# Patient Record
Sex: Male | Born: 1940 | Race: White | Hispanic: No | Smoking: Former smoker
Health system: Southern US, Community
[De-identification: ages and names within clinical notes are randomized; demographics above are authoritative.]

## PROBLEM LIST (undated history)

## (undated) DIAGNOSIS — N529 Male erectile dysfunction, unspecified: Secondary | ICD-10-CM

## (undated) DIAGNOSIS — Z8739 Personal history of other diseases of the musculoskeletal system and connective tissue: Secondary | ICD-10-CM

## (undated) DIAGNOSIS — E119 Type 2 diabetes mellitus without complications: Secondary | ICD-10-CM

## (undated) DIAGNOSIS — C61 Malignant neoplasm of prostate: Secondary | ICD-10-CM

## (undated) DIAGNOSIS — E785 Hyperlipidemia, unspecified: Secondary | ICD-10-CM

## (undated) DIAGNOSIS — E876 Hypokalemia: Secondary | ICD-10-CM

## (undated) DIAGNOSIS — I10 Essential (primary) hypertension: Secondary | ICD-10-CM

## (undated) DIAGNOSIS — E6609 Other obesity due to excess calories: Secondary | ICD-10-CM

## (undated) HISTORY — DX: Hypokalemia: E87.6

## (undated) HISTORY — DX: Personal history of other diseases of the musculoskeletal system and connective tissue: Z87.39

## (undated) HISTORY — DX: Other obesity due to excess calories: E66.09

## (undated) HISTORY — DX: Hyperlipidemia, unspecified: E78.5

## (undated) HISTORY — DX: Type 2 diabetes mellitus without complications: E11.9

## (undated) HISTORY — DX: Malignant neoplasm of prostate: C61

## (undated) HISTORY — DX: Essential (primary) hypertension: I10

## (undated) HISTORY — DX: Male erectile dysfunction, unspecified: N52.9

## (undated) HISTORY — PX: CHOLECYSTECTOMY: SHX55

---

## 1998-01-10 ENCOUNTER — Ambulatory Visit (HOSPITAL_COMMUNITY): Admission: RE | Admit: 1998-01-10 | Discharge: 1998-01-10 | Payer: Self-pay | Admitting: Internal Medicine

## 2012-06-06 ENCOUNTER — Ambulatory Visit: Payer: Self-pay | Admitting: Neurology

## 2012-09-05 ENCOUNTER — Ambulatory Visit: Payer: Self-pay | Admitting: Radiation Oncology

## 2012-10-01 ENCOUNTER — Ambulatory Visit: Payer: Self-pay | Admitting: Radiation Oncology

## 2012-10-30 LAB — CBC CANCER CENTER
Eosinophil %: 4.4 %
HCT: 42 % (ref 40.0–52.0)
HGB: 14.6 g/dL (ref 13.0–18.0)
Lymphocyte %: 26 %
MCH: 29.8 pg (ref 26.0–34.0)
MCHC: 34.9 g/dL (ref 32.0–36.0)
MCV: 85 fL (ref 80–100)
Neutrophil #: 4 x10 3/mm (ref 1.4–6.5)
Neutrophil %: 60 %
Platelet: 238 x10 3/mm (ref 150–440)
RBC: 4.92 10*6/uL (ref 4.40–5.90)

## 2012-11-01 ENCOUNTER — Ambulatory Visit: Payer: Self-pay | Admitting: Radiation Oncology

## 2012-11-06 LAB — CBC CANCER CENTER
Basophil %: 0.6 %
Eosinophil %: 6.4 %
HGB: 14.4 g/dL (ref 13.0–18.0)
MCHC: 35.3 g/dL (ref 32.0–36.0)
Monocyte #: 0.6 x10 3/mm (ref 0.2–1.0)
Monocyte %: 9.5 %
Neutrophil #: 3.8 x10 3/mm (ref 1.4–6.5)
Neutrophil %: 58 %
Platelet: 218 x10 3/mm (ref 150–440)

## 2012-11-20 LAB — CBC CANCER CENTER
Basophil #: 0 x10 3/mm (ref 0.0–0.1)
Basophil %: 0.4 %
Eosinophil #: 0.2 x10 3/mm (ref 0.0–0.7)
HGB: 14.2 g/dL (ref 13.0–18.0)
Lymphocyte #: 1.2 x10 3/mm (ref 1.0–3.6)
MCH: 29.6 pg (ref 26.0–34.0)
MCV: 84 fL (ref 80–100)
Monocyte #: 0.7 x10 3/mm (ref 0.2–1.0)
Neutrophil %: 64 %
RBC: 4.79 10*6/uL (ref 4.40–5.90)
WBC: 5.7 x10 3/mm (ref 3.8–10.6)

## 2012-11-27 LAB — CBC CANCER CENTER
Basophil #: 0 x10 3/mm (ref 0.0–0.1)
Basophil %: 0.4 %
HCT: 39.2 % — ABNORMAL LOW (ref 40.0–52.0)
HGB: 13.7 g/dL (ref 13.0–18.0)
MCH: 29.7 pg (ref 26.0–34.0)
MCHC: 34.9 g/dL (ref 32.0–36.0)
Monocyte %: 9.5 %
Neutrophil #: 4 x10 3/mm (ref 1.4–6.5)
RDW: 13.5 % (ref 11.5–14.5)
WBC: 5.7 x10 3/mm (ref 3.8–10.6)

## 2012-11-29 ENCOUNTER — Ambulatory Visit: Payer: Self-pay | Admitting: Radiation Oncology

## 2012-12-30 ENCOUNTER — Ambulatory Visit: Payer: Self-pay | Admitting: Radiation Oncology

## 2013-05-01 ENCOUNTER — Ambulatory Visit: Payer: Self-pay | Admitting: Radiation Oncology

## 2013-05-21 LAB — PSA: PSA: 0.5 ng/mL (ref 0.0–4.0)

## 2013-06-01 ENCOUNTER — Ambulatory Visit: Payer: Self-pay | Admitting: Radiation Oncology

## 2013-11-03 ENCOUNTER — Ambulatory Visit: Payer: Self-pay | Admitting: Radiation Oncology

## 2013-11-05 LAB — PSA: PSA: 0.2 ng/mL (ref 0.0–4.0)

## 2013-11-29 ENCOUNTER — Ambulatory Visit: Payer: Self-pay | Admitting: Radiation Oncology

## 2014-11-05 ENCOUNTER — Ambulatory Visit: Payer: Self-pay | Admitting: Radiation Oncology

## 2014-11-30 ENCOUNTER — Ambulatory Visit
Admit: 2014-11-30 | Disposition: A | Discharge status: Home / Self Care | Payer: Self-pay | Attending: Radiation Oncology | Admitting: Radiation Oncology

## 2015-01-18 NOTE — Consult Note (Signed)
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 He has no frequency and urgency but does complain of nocturia x2-3. PSA recently was found tobe 9.0 which prompted transrectal ultrasound-guided biopsy showing by lobar adenocarcinoma mostly Gleason Gleason score of 7 (3+4) with some Gleason 6 as well as high grade PIN. he has been evaluated at [facility] and markers were placed for image guideIMRT radiation therapy. He is seen at our facility today to discuss going ahead with treatment. He is doing well at the present time. He was also offered Lupron although decline that based on side effects of hot flashes and decreased  libido. Seen today consultation regarding radiation therapy  Pathology Report: pathology report reviewed    Imaging Report: bone scan not indicated   Referral Report: Clinical Notes Reviewed.  Planned Treatment Regimen:image guided IMRT radiation therapy   Past History: Medical History Diabetes, Hyperlipedemia and Hypertension.   Procedure/Surgical History excision of moles from back, and vasectomy.  Allergies:   No Known Allergies  Medications:   Amlodipine 1 (10 mg) TABLET ORAL daily (09/04/2012)      Atenolol 1 (100 mg) TABLET ORAL daily (09/04/2012)       Hydrochlorothiazide 1 (25 mg) TABLET ORAL daily (09/04/2012)      Lovastatin 1 (40 mg) TABLET ORAL daily (09/04/2012)      Lowdermill 1 (10 meq) TABLET ORAL b.i.d. (09/04/2012)      Metformin HCL 1 (1000 mg) TABLET ORAL b.i.d. (09/04/2012)      Niacin ER  (09/04/2012)  Review of Systems:   according to nurse's notesPatient denies any weight loss, fatigue, weakness, fever, chills or night sweats. Patient denies any loss of vision, blurred vision. Patient denies any ringing  othe ears or hearing loss. No irregular heartbeat. Patient denies heart murmur or history of fainting. Patient denies any chest pain or pain radiating to her upper extremities. Patient denies any shortness of breath, difficulty breathing at night, cough ohemoptysis. Patient denies any swelling in the lower legs. Patient denies any nausea vomiting, vomiting of blood, or coffee ground material in the vomitus. Patient denies any stomach pain. Patient states has had normal bowel movements no significant consor diarrhea. Patient denies any dysuria, hematuria or significant nocturia. Patient denies any problems walking, swelling in the joints or loss of balance. Patient denies any skin changes, loss of hair or loss of weight. Patient denies any excessiworrying or anxiety or significant depression. Patient denies any problems with insomnia. Patient denies excessive thirst, polyuria, polydipsia. Patient denies any swollen glands, patient denies easy bruising or easy bleeding. Patient denies any recent infections, allergies or URI. Patient "s visual fields have not changed significantly in recent time.  Nursing Vital Signs: Performed on 09/05/2012 8:10 AM  BMI - 30.763 kg/m2 (HIGH)  Height - 179.3 cm  Weight - 98.9 kg  Temperature - 98 F  Pulse - 69 /min  Respiration - 20 /min  Systolic - 073 mm(hg) (HIGH)  Diastolic - 77 mm(hg)  Pain - 0  Fatigue - 0  BP - 154/ 77  Physical Exam:  Constitutional No evidence of impaired  alertness, inadequate appearance, premature or advanced chronologic age, uncooperativeness, developmental delays, altered mood and affect and disorientation.  Head No evidence of alopecia, abnormal cephalic and scars.  Eyes No evidence of conjunctivitis, keratitis, hardening of the lens, nonreactive pupil(s), retinal abnormalities and scleral abnormalities.  ENMT No evidence of ear abnormalities, oral abnormalities, nasal obstruction, oropharynx obstruction, sinusitis, throat abnormalities and tongue abnormalities.  Neck No evidence of tender or enlarged lymph nodes, neck abnormalities, restricted range of motion and enlarged thyroid gland.  Integumentary No evidence of blistering, bruising, dry skin, erythema, nails changes, altered pigmentation, rash and urticaria.  Breasts No evidence of a breast mass(es), nipple discharge, nipple inversion, breast skin changes and asymmetry.  Chest No evidence of chest abnormalities and tender or enlarged lymph nodes.  Cardiovascular No evidence of arterial pulse(s) abnormalities, abnormal heart rate, heart arrhythmia and abnormal heart sounds.  Respiratory No evidence of abnormal breath sounds, chest abnormalities on palpation and chest abnormalities on percussion.  Gastrointestinal No evidence of anal and/or perineal abnormalities and rectal abnormalities.  Abdomen No evidence of abdominal abnormalities, abowel sounds, hepatomegaly and splenomegaly.  Genitourinary on rectal exam rectal sphincter tone is good. Prostate is somewhat nodular based on probable fiduciary marker placement. sulcus is preserved bilaterally. seminal vesicle regions appear within normal limits. No other rectal abnormalities identified.  Extremities No evidence of lower extremities abnormalities, tender or enlarged lymph nodes and upper extremities abnormalities.  Back/Spine No evidence of reduced flexibility and abnormal spinal curvature.  Musculoskeletal No evidence of bone abnormalities,  joint abnormalities, compromised muscle tone and restricted range of motion.  Neurologic No evidence of impaired cranial nerve(s), uncoordinated gait, motor impairment, a sensory deficit and impaired reflexes.  Hematologic/Lymphatic No evidence of tender or enlarged axillae lymph nodes, tender or enlarged lymph nodes, tender or enlarged groin lymph nodes, tender or enlarged neck lymph nodes and petechiae / purpura / ecchymosis.    Impression/Plan:   clinical stage IIa adenocarcinoma the prostate in 74 year old male with Gleason 7 (3+4) presenting with a PSA of 9  at this time patient has decide on going ahead with image guided IMRT radiation therapy. Would plan on delivering 8000 cGy over 8 weekusing his fiduciary markers and laboratory been placed. I have set him up for CT simulation after the first of the year which is fine with the patient. risks and benefits of treatment including increased probability of erectile dysfunction, diarrhea, insymptoms as urgency and frequency during treatment, all explained in detail to the patient. we'll be treating his prostate and proximal seminal vesicles. Do not see a need based on his PSA and Gleason score 4 adjuvant treatment of his pelvic lymph   would like to take this opportunity to thank you for allowing me to participate in this patient's care.     cc:  [facility]

## 2015-11-07 ENCOUNTER — Inpatient Hospital Stay: Admission: RE | Admit: 2015-11-07 | Payer: Self-pay | Source: Ambulatory Visit | Admitting: Radiation Oncology

## 2015-11-07 ENCOUNTER — Ambulatory Visit: Payer: Self-pay | Admitting: Radiation Oncology

## 2021-04-13 ENCOUNTER — Encounter: Payer: Self-pay | Admitting: Physician Assistant

## 2021-04-24 ENCOUNTER — Inpatient Hospital Stay: Payer: Medicare Other | Attending: Oncology | Admitting: Oncology

## 2021-04-24 ENCOUNTER — Inpatient Hospital Stay: Payer: Medicare Other

## 2021-04-24 ENCOUNTER — Encounter: Payer: Self-pay | Admitting: Oncology

## 2021-04-24 ENCOUNTER — Other Ambulatory Visit: Payer: Self-pay

## 2021-04-24 VITALS — BP 162/79 | HR 78 | Temp 98.0°F | Resp 18 | Wt 202.3 lb

## 2021-04-24 DIAGNOSIS — E785 Hyperlipidemia, unspecified: Secondary | ICD-10-CM | POA: Diagnosis not present

## 2021-04-24 DIAGNOSIS — Z8546 Personal history of malignant neoplasm of prostate: Secondary | ICD-10-CM | POA: Diagnosis not present

## 2021-04-24 DIAGNOSIS — Z833 Family history of diabetes mellitus: Secondary | ICD-10-CM | POA: Insufficient documentation

## 2021-04-24 DIAGNOSIS — Z79899 Other long term (current) drug therapy: Secondary | ICD-10-CM | POA: Diagnosis not present

## 2021-04-24 DIAGNOSIS — R972 Elevated prostate specific antigen [PSA]: Secondary | ICD-10-CM | POA: Insufficient documentation

## 2021-04-24 DIAGNOSIS — Z7189 Other specified counseling: Secondary | ICD-10-CM

## 2021-04-24 DIAGNOSIS — C61 Malignant neoplasm of prostate: Secondary | ICD-10-CM | POA: Insufficient documentation

## 2021-04-24 DIAGNOSIS — R5383 Other fatigue: Secondary | ICD-10-CM

## 2021-04-24 NOTE — Progress Notes (Signed)
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  His pretreatment PSA was 9.  He was seen by Dr. Donella Stade and underwent IMRT to his prostate.  He has never undergone radical prostatectomy.His PSA subsequently went down to 0.2 in 2015.  More recently when patient had his blood work done his PSA was elevated at 1.6 and therefore patient has been referred to [place].  It appears that patient has never received any antiandrogen therapy in the past.  Patient currently lives with his son and is independent of his ADLs and IADLs.  He reports some fatigue but denies other complaints at this time  ECOG PS- 1  Pain scale- 0   Review of systems- Review of Systems  Constitutional:  Positive for malaise/fatigue. Negative for chills, fever and weight loss.  HENT:  Negative for congestion, ear discharge and nosebleeds.   Eyes:  Negative for blurred vision.  Respiratory:  Negative for cough, hemoptysis, sputum production, shortness of breath and wheezing.   Cardiovascular:  Negative for chest pain, palpitations, orthopnea and claudication.  Gastrointestinal:  Negative for abdominal pain, blood in stool, constipation, diarrhea, heartburn, melena, nausea and vomiting.  Genitourinary:  Negative for dysuria, flank pain, frequency, hematuria and urgency.  Musculoskeletal:  Negative for back pain, joint pain and myalgias.  Skin:  Negative for rash.  Neurological:  Negative  for dizziness, tingling, focal weakness, seizures, weakness and headaches.  Endo/Heme/Allergies:  Does not bruise/bleed easily.  Psychiatric/Behavioral:  Negative for depression and suicidal ideas. The patient does not have insomnia.    No Known Allergies  Patient Active Problem List   Diagnosis Date Noted   Hyperlipidemia 04/24/2021   Goals of care, counseling/discussion 04/24/2021   Prostate cancer [facility])      Past Medical History:  Diagnosis Date   Diabetes mellitus, type 2 ([place])    Erectile dysfunction    Exogenous obesity    Hx of gout    Hyperlipidemia    Hypertension    Hypokalemia    Prostate cancer ([place])      History reviewed. No pertinent surgical history.  Social History   Socioeconomic History   Marital status: Unknown    Spouse name: Not on file   Number of children: Not on file   Years of education: Not on file   Highest education level: Not on file  Occupational History   Not on file  Tobacco Use   Smoking status: Former    Types: Cigarettes   Smokeless tobacco: Never  Vaping Use   Vaping Use: Never used  Substance and Sexual Activity   Alcohol use: Not on file   Drug use: Not on file   Sexual activity: Not on file  Other Topics Concern   Not on file  Social History Narrative   Not on file   Social Determinants of Health   Financial Resource Strain: Not on file  Food Insecurity: Not on file  Transportation Needs: Not on file  Physical Activity: Not on file  Stress: Not on file  Social Connections: Not on file  Intimate Partner Violence: Not on file     Family History  Problem Relation Age of Onset   Diabetes Father      Current Outpatient Medications:    amLODipine (NORVASC) 10 MG tablet, Take 10 mg by mouth daily., Disp: , Rfl:    aspirin 81 MG EC tablet, Take by mouth., Disp: , Rfl:    atenolol (TENORMIN) 100 MG tablet, Take 100 mg by mouth daily., Disp: , Rfl:    atorvastatin (LIPITOR) 80 MG tablet, Take 80 mg by mouth at  bedtime., Disp: , Rfl:    canagliflozin (INVOKANA) 100 MG TABS tablet, Take by mouth., Disp: , Rfl:    glipiZIDE (GLUCOTROL) 5 MG tablet, Take by mouth., Disp: , Rfl:    lisinopril-hydrochlorothiazide (ZESTORETIC) 20-12.5 MG tablet, Take 1 tablet by mouth 2 (two) times daily., Disp: , Rfl:    metFORMIN (GLUCOPHAGE) 1000 MG tablet, Take 1,000 mg by mouth 2 (two) times daily., Disp: , Rfl:    niacin (SLO-NIACIN) 500 MG tablet, Take by mouth., Disp: , Rfl:    potassium chloride (KLOR-CON) 10 MEQ tablet, Take 10 mEq by mouth daily., Disp: , Rfl:    potassium chloride (KLOR-CON) 10 MEQ tablet, Take by mouth., Disp: , Rfl:    Physical exam:  Vitals:   04/24/21 1452  BP: (!) 162/79  Pulse: 78  Resp: 18  Temp: 98 F (36.7 C)  SpO2: 96%  Weight: 202 lb 4.8 oz (91.8 kg)   Physical Exam Constitutional:      General: He is not in acute distress. Cardiovascular:     Rate and Rhythm: Normal rate and regular rhythm.     Heart sounds: Normal heart sounds.  Pulmonary:     Effort: Pulmonary effort is normal.     Breath sounds: Normal breath sounds.  Abdominal:     General: Bowel sounds are normal.     Palpations: Abdomen is soft.  Skin:    General: Skin is warm and dry.  Neurological:     Mental Status: He is alert and oriented to person, place, and time.       No flowsheet data found. CBC Latest Ref Rng & Units 11/27/2012  WBC 3.8 - 10.6 x10 3/mm  5.7  Hemoglobin 13.0 - 18.0 g/dL 13.7  Hematocrit 40.0 - 52.0 % 39.2(L)  Platelets 150 - 440 x10 3/mm  209   Assessment and plan- Patient is a 80 y.o. male with history of stage II adenocarcinoma of the prostate T1 cN0 M0 diagnosed back in 2013 s/p IMRT now referred for concern for possible biochemical recurrence  Following radiation treatment patient's PSA dropped down from 9.9-0.2 in 2015.  Subsequently he did not have any PSAs checked and most recent PSA was elevated at 1.6.  In order to define biochemical recurrence after radiation  treatment PSA should be more than 2.  It has been more than 9 years since his prostate cancer diagnosis and he does not have any high risk factors such as early recurrence or high Gleason score of 8 or 9.  I am therefore inclined to watch his PSA conservatively without starting any antiandrogen therapy at this time.  Most recent CBC and CMP was normal including a normal alkaline phosphatase  Repeat CBC with differential CMP and PSA in 2 months in 4 months and I will see him back in 4 months.  If there is a consistent rise in his PSA we  will also obtain staging scans including a bone scan.  I doubt that he would be a candidate for salvage prostatectomy at this time when it was not offered as a primary modality even back in 2013.  Certainly antiandrogen therapy can be considered if there is a consistent increase in his PSA but we do not have to do that just yet given the potential for side effects and impact on quality of life.  I explained to the patient the natural history of prostate cancer and the role of ADT and its management.  Patient verbalized understanding   Thank you for this kind referral and the opportunity to participate in the care of this patient   Visit Diagnosis 1. Prostate cancer ([place])   2. Goals of care, counseling/discussion     Dr. Randa Evens, MD, MPH [facility] at [facility] [id number] 04/24/2021 4:48 PM

## 2021-06-26 ENCOUNTER — Encounter (INDEPENDENT_AMBULATORY_CARE_PROVIDER_SITE_OTHER): Payer: Self-pay

## 2021-06-26 ENCOUNTER — Telehealth: Payer: Self-pay

## 2021-06-26 ENCOUNTER — Inpatient Hospital Stay: Payer: Medicare Other | Attending: Oncology

## 2021-06-26 DIAGNOSIS — C61 Malignant neoplasm of prostate: Secondary | ICD-10-CM | POA: Insufficient documentation

## 2021-06-26 LAB — COMPREHENSIVE METABOLIC PANEL
ALT: 14 U/L (ref 0–44)
AST: 23 U/L (ref 15–41)
Albumin: 4 g/dL (ref 3.5–5.0)
Alkaline Phosphatase: 58 U/L (ref 38–126)
Anion gap: 10 (ref 5–15)
BUN: 23 mg/dL (ref 8–23)
CO2: 31 mmol/L (ref 22–32)
Calcium: 9.2 mg/dL (ref 8.9–10.3)
Chloride: 96 mmol/L — ABNORMAL LOW (ref 98–111)
Creatinine, Ser: 1.14 mg/dL (ref 0.61–1.24)
GFR, Estimated: >60 mL/min (ref >60)
Glucose, Bld: 139 mg/dL — ABNORMAL HIGH (ref 70–99)
Potassium: 3.9 mmol/L (ref 3.5–5.1)
Sodium: 137 mmol/L (ref 135–145)
Total Bilirubin: 1 mg/dL (ref 0.3–1.2)
Total Protein: 7 g/dL (ref 6.5–8.1)

## 2021-06-26 LAB — CBC WITH DIFFERENTIAL/PLATELET
Abs Immature Granulocytes: 0.04 10*3/uL (ref 0.00–0.07)
Basophils Absolute: 0 10*3/uL (ref 0.0–0.1)
Basophils Relative: 0 %
Eosinophils Absolute: 0.4 10*3/uL (ref 0.0–0.5)
Eosinophils Relative: 5 %
HCT: 37.9 % — ABNORMAL LOW (ref 39.0–52.0)
Hemoglobin: 13.2 g/dL (ref 13.0–17.0)
Immature Granulocytes: 1 %
Lymphocytes Relative: 17 %
Lymphs Abs: 1.5 10*3/uL (ref 0.7–4.0)
MCH: 29.8 pg (ref 26.0–34.0)
MCHC: 34.8 g/dL (ref 30.0–36.0)
MCV: 85.6 fL (ref 80.0–100.0)
Monocytes Absolute: 0.8 10*3/uL (ref 0.1–1.0)
Monocytes Relative: 9 %
Neutro Abs: 5.7 10*3/uL (ref 1.7–7.7)
Neutrophils Relative %: 68 %
Platelets: 242 10*3/uL (ref 150–400)
RBC: 4.43 MIL/uL (ref 4.22–5.81)
RDW: 12.9 % (ref 11.5–15.5)
WBC: 8.4 10*3/uL (ref 4.0–10.5)
nRBC: 0 % (ref 0.0–0.2)

## 2021-06-26 LAB — PSA: Prostatic Specific Antigen: 1.65 ng/mL (ref 0.00–4.00)

## 2021-08-04 NOTE — Telephone Encounter (Signed)
See previous note on 9/26 

## 2021-08-28 ENCOUNTER — Other Ambulatory Visit: Payer: Medicare Other

## 2021-08-28 ENCOUNTER — Ambulatory Visit: Payer: Medicare Other | Admitting: Oncology

## 2021-08-29 ENCOUNTER — Other Ambulatory Visit: Payer: Self-pay | Admitting: *Deleted

## 2021-08-29 DIAGNOSIS — C61 Malignant neoplasm of prostate: Secondary | ICD-10-CM

## 2021-08-30 ENCOUNTER — Encounter: Payer: Self-pay | Admitting: Internal Medicine

## 2021-08-30 ENCOUNTER — Inpatient Hospital Stay: Payer: Medicare Other | Admitting: Internal Medicine

## 2021-08-30 ENCOUNTER — Other Ambulatory Visit: Payer: Self-pay

## 2021-08-30 ENCOUNTER — Inpatient Hospital Stay: Payer: Medicare Other | Attending: Internal Medicine

## 2021-08-30 VITALS — BP 130/72 | HR 77 | Temp 98.4°F | Resp 20 | Wt 201.7 lb

## 2021-08-30 DIAGNOSIS — Z8546 Personal history of malignant neoplasm of prostate: Secondary | ICD-10-CM

## 2021-08-30 DIAGNOSIS — Z79899 Other long term (current) drug therapy: Secondary | ICD-10-CM | POA: Diagnosis not present

## 2021-08-30 DIAGNOSIS — R0602 Shortness of breath: Secondary | ICD-10-CM | POA: Diagnosis not present

## 2021-08-30 DIAGNOSIS — C61 Malignant neoplasm of prostate: Secondary | ICD-10-CM | POA: Diagnosis not present

## 2021-08-30 DIAGNOSIS — Z87891 Personal history of nicotine dependence: Secondary | ICD-10-CM | POA: Diagnosis not present

## 2021-08-30 DIAGNOSIS — Z08 Encounter for follow-up examination after completed treatment for malignant neoplasm: Secondary | ICD-10-CM

## 2021-08-30 DIAGNOSIS — M2550 Pain in unspecified joint: Secondary | ICD-10-CM | POA: Insufficient documentation

## 2021-08-30 DIAGNOSIS — R350 Frequency of micturition: Secondary | ICD-10-CM | POA: Insufficient documentation

## 2021-08-30 DIAGNOSIS — Z833 Family history of diabetes mellitus: Secondary | ICD-10-CM | POA: Insufficient documentation

## 2021-08-30 DIAGNOSIS — R062 Wheezing: Secondary | ICD-10-CM | POA: Insufficient documentation

## 2021-08-30 DIAGNOSIS — M549 Dorsalgia, unspecified: Secondary | ICD-10-CM | POA: Insufficient documentation

## 2021-08-30 LAB — CBC WITH DIFFERENTIAL/PLATELET
Abs Immature Granulocytes: 0.05 10*3/uL (ref 0.00–0.07)
Basophils Absolute: 0 10*3/uL (ref 0.0–0.1)
Basophils Relative: 0 %
Eosinophils Absolute: 0.2 10*3/uL (ref 0.0–0.5)
Eosinophils Relative: 2 %
HCT: 38 % — ABNORMAL LOW (ref 39.0–52.0)
Hemoglobin: 13.2 g/dL (ref 13.0–17.0)
Immature Granulocytes: 1 %
Lymphocytes Relative: 14 %
Lymphs Abs: 1.5 10*3/uL (ref 0.7–4.0)
MCH: 29.2 pg (ref 26.0–34.0)
MCHC: 34.7 g/dL (ref 30.0–36.0)
MCV: 84.1 fL (ref 80.0–100.0)
Monocytes Absolute: 0.9 10*3/uL (ref 0.1–1.0)
Monocytes Relative: 9 %
Neutro Abs: 8.1 10*3/uL — ABNORMAL HIGH (ref 1.7–7.7)
Neutrophils Relative %: 74 %
Platelets: 426 10*3/uL — ABNORMAL HIGH (ref 150–400)
RBC: 4.52 MIL/uL (ref 4.22–5.81)
RDW: 12.3 % (ref 11.5–15.5)
WBC: 10.8 10*3/uL — ABNORMAL HIGH (ref 4.0–10.5)
nRBC: 0 % (ref 0.0–0.2)

## 2021-08-30 LAB — COMPREHENSIVE METABOLIC PANEL
ALT: 91 U/L — ABNORMAL HIGH (ref 0–44)
AST: 67 U/L — ABNORMAL HIGH (ref 15–41)
Albumin: 3 g/dL — ABNORMAL LOW (ref 3.5–5.0)
Alkaline Phosphatase: 93 U/L (ref 38–126)
Anion gap: 10 (ref 5–15)
BUN: 18 mg/dL (ref 8–23)
CO2: 29 mmol/L (ref 22–32)
Calcium: 8.8 mg/dL — ABNORMAL LOW (ref 8.9–10.3)
Chloride: 93 mmol/L — ABNORMAL LOW (ref 98–111)
Creatinine, Ser: 0.95 mg/dL (ref 0.61–1.24)
GFR, Estimated: >60 mL/min (ref >60)
Glucose, Bld: 145 mg/dL — ABNORMAL HIGH (ref 70–99)
Potassium: 3.2 mmol/L — ABNORMAL LOW (ref 3.5–5.1)
Sodium: 132 mmol/L — ABNORMAL LOW (ref 135–145)
Total Bilirubin: 0.7 mg/dL (ref 0.3–1.2)
Total Protein: 7.7 g/dL (ref 6.5–8.1)

## 2021-08-30 LAB — PSA: Prostatic Specific Antigen: 3.97 ng/mL (ref 0.00–4.00)

## 2021-08-30 NOTE — Progress Notes (Signed)
[facility] CONSULT NOTE  Patient Care Team: Driggers, Sharen Hint as PCP - General (Physician Assistant)  CHIEF COMPLAINTS/PURPOSE OF CONSULTATION: PROSTATE CANCER  # 2013-stage II adenocarcinoma of the prostate T1 cN0 M0 with a Gleason score of 73+4 back in 2013.  His pretreatment PSA was 9.  S/p  IMRT to his prostate.  He has never undergone radical prostatectomy.His PSA subsequently went down to 0.2 in 2015.  More recently when patient had his blood work done his PSA was elevated at 1.6 and therefore patient has been referred to [place].  It appears that patient has never received any antiandrogen therapy in the past.   HISTORY OF PRESENTING ILLNESS:  Devon Anderson 79 y.o.  male above history of prostate cancer is here for follow-up.  Patient denies any bone pain.  Denies any nausea vomiting abdominal pain.  Appetite is good.  No weight loss.  Recently patient evaluated with PCP for wheezing/shortness of breath.  S/p chest x-ray.  Mild cough.  Review of Systems  Constitutional:  Negative for chills, diaphoresis, fever, malaise/fatigue and weight loss.  HENT:  Negative for nosebleeds and sore throat.   Eyes:  Negative for double vision.  Respiratory:  Positive for shortness of breath and wheezing. Negative for cough, hemoptysis and sputum production.   Cardiovascular:  Negative for chest pain, palpitations, orthopnea and leg swelling.  Gastrointestinal:  Negative for abdominal pain, blood in stool, constipation, diarrhea, heartburn, melena, nausea and vomiting.  Genitourinary:  Negative for dysuria, frequency and urgency.  Musculoskeletal:  Positive for back pain and joint pain.  Skin: Negative.  Negative for itching and rash.  Neurological:  Negative for dizziness, tingling, focal weakness, weakness and headaches.  Endo/Heme/Allergies:  Does not bruise/bleed easily.  Psychiatric/Behavioral:  Negative for depression. The patient is not nervous/anxious and does not have  insomnia.     MEDICAL HISTORY:  Past Medical History:  Diagnosis Date   Diabetes mellitus, type 2 ([place])    Erectile dysfunction    Exogenous obesity    Hx of gout    Hyperlipidemia    Hypertension    Hypokalemia    Prostate cancer ([place])     SURGICAL HISTORY: History reviewed. No pertinent surgical history.  SOCIAL HISTORY: Social History   Socioeconomic History   Marital status: Unknown    Spouse name: Not on file   Number of children: Not on file   Years of education: Not on file   Highest education level: Not on file  Occupational History   Not on file  Tobacco Use   Smoking status: Former    Types: Cigarettes   Smokeless tobacco: Never  Vaping Use   Vaping Use: Never used  Substance and Sexual Activity   Alcohol use: Not on file   Drug use: Not on file   Sexual activity: Not on file  Other Topics Concern   Not on file  Social History Narrative   Not on file   Social Determinants of Health   Financial Resource Strain: Not on file  Food Insecurity: Not on file  Transportation Needs: Not on file  Physical Activity: Not on file  Stress: Not on file  Social Connections: Not on file  Intimate Partner Violence: Not on file    FAMILY HISTORY: Family History  Problem Relation Age of Onset   Diabetes Father     ALLERGIES:  has No Known Allergies.  MEDICATIONS:  Current Outpatient Medications  Medication Sig Dispense Refill   amLODipine (  NORVASC) 10 MG tablet Take 10 mg by mouth daily.     amoxicillin (AMOXIL) 875 MG tablet Take 875 mg by mouth 2 (two) times daily.     aspirin 81 MG EC tablet Take by mouth.     atenolol (TENORMIN) 100 MG tablet Take 100 mg by mouth daily.     atorvastatin (LIPITOR) 80 MG tablet Take 80 mg by mouth at bedtime.     canagliflozin (INVOKANA) 100 MG TABS tablet Take by mouth.     glipiZIDE (GLUCOTROL) 5 MG tablet Take by mouth.     lisinopril-hydrochlorothiazide (ZESTORETIC) 20-12.5 MG tablet Take 1 tablet by mouth 2 (two)  times daily.     metFORMIN (GLUCOPHAGE) 1000 MG tablet Take 1,000 mg by mouth 2 (two) times daily.     niacin (SLO-NIACIN) 500 MG tablet Take by mouth.     potassium chloride (KLOR-CON) 10 MEQ tablet Take 10 mEq by mouth daily.     potassium chloride (KLOR-CON) 10 MEQ tablet Take by mouth.     No current facility-administered medications for this visit.      Marland Kitchen  PHYSICAL EXAMINATION:  Vitals:   08/30/21 1114  BP: 130/72  Pulse: 77  Resp: 20  Temp: 98.4 F (36.9 C)   Filed Weights   08/30/21 1114  Weight: 201 lb 11.2 oz (91.5 kg)    Physical Exam Vitals and nursing note reviewed.  HENT:     Head: Normocephalic and atraumatic.     Mouth/Throat:     Pharynx: Oropharynx is clear.  Eyes:     Extraocular Movements: Extraocular movements intact.     Pupils: Pupils are equal, round, and reactive to light.  Cardiovascular:     Rate and Rhythm: Normal rate and regular rhythm.  Pulmonary:     Breath sounds: Wheezing present.     Comments: Decreased breath sounds bilaterally.  Abdominal:     Palpations: Abdomen is soft.  Musculoskeletal:        General: Normal range of motion.     Cervical back: Normal range of motion.  Skin:    General: Skin is warm.  Neurological:     General: No focal deficit present.     Mental Status: He is alert and oriented to person, place, and time.  Psychiatric:        Behavior: Behavior normal.        Judgment: Judgment normal.     LABORATORY DATA:  I have reviewed the data as listed Lab Results  Component Value Date   WBC 10.8 (H) 08/30/2021   HGB 13.2 08/30/2021   HCT 38.0 (L) 08/30/2021   MCV 84.1 08/30/2021   PLT 426 (H) 08/30/2021   Recent Labs    06/26/21 0939 08/30/21 1002  NA 137 132*  K 3.9 3.2*  CL 96* 93*  CO2 31 29  GLUCOSE 139* 145*  BUN 23 18  CREATININE 1.14 0.95  CALCIUM 9.2 8.8*  GFRNONAA >60 >60  PROT 7.0 7.7  ALBUMIN 4.0 3.0*  AST 23 67*  ALT 14 91*  ALKPHOS 58 93  BILITOT 1.0 0.7    RADIOGRAPHIC  STUDIES: I have personally reviewed the radiological images as listed and agreed with the findings in the report. No results found.  Prostate cancer [facility]) #  history of stage II adenocarcinoma of the prostate T1 cN0 M0 diagnosed back in 2013 s/p IMRT now referred for concern for possible biochemical recurrence.  Pretreatment PSA 9.9  #Patient's PSA postradiation-0.2 [7829]; no further  PSAs noted.  #September 2022-PSA 1.6.  PSA December 2022-3.96 [facility] confirms biochemical recurrence post radiation since PSA > 2].  Would recommend imaging.   #I discussed the management of biochemical performance-will be based upon restaging imaging etc.    ADT can be considered-however this has to be balanced in the context of his age risk of side effects etc.  #Increased frequency of urination-chronic; stable as per patient.  Does not want any pharmacotherapy.  #Bilateral scattered wheezing-awaiting chest x-ray with PCP defer to PCP for further work-up.   # DISPOSITION:will call  # follow up TBD-Dr.B    All questions were answered. The patient knows to call the clinic with any problems, questions or concerns.       Cammie Sickle, MD 08/31/2021 8:48 AM

## 2021-08-30 NOTE — Assessment & Plan Note (Addendum)
#    history of stage II adenocarcinoma of the prostate T1 cN0 M0 diagnosed back in 2013 s/p IMRT now referred for concern for possible biochemical recurrence.  Pretreatment PSA 9.9  #Patient's PSA postradiation-0.2 [2015]; no further PSAs noted.  #September 2022-PSA 1.6.  PSA December 2022-3.96 [facility] confirms biochemical recurrence post radiation since PSA > 2].  Would recommend imaging.   #I discussed the management of biochemical performance-will be based upon restaging imaging etc.    ADT can be considered-however this has to be balanced in the context of his age risk of side effects etc.  #Increased frequency of urination-chronic; stable as per patient.  Does not want any pharmacotherapy.  #Bilateral scattered wheezing-awaiting chest x-ray with PCP defer to PCP for further work-up.  # DISPOSITION:will call  # follow up TBD-Dr.B

## 2021-08-30 NOTE — Progress Notes (Signed)
Hematology/Oncology Consult Note [facility] Telephone:([phone] [phone]  Patient Care Team: Driggers, Sharen Hint as PCP - General (Physician Assistant)   Name of the patient: Devon Anderson  [id number]  02-12-1941    Reason for referral- prostate cancer surveillance   Referring physician-Laura Driggers, PA  Date of visit: 08/30/21     ECOG PS- 1  Pain scale- 0   Review of systems- Review of Systems  Constitutional:  Positive for malaise/fatigue. Negative for chills, fever and weight loss.  HENT:  Negative for congestion, ear discharge and nosebleeds.   Eyes:  Negative for blurred vision.  Respiratory:  Negative for cough, hemoptysis, sputum production, shortness of breath and wheezing.   Cardiovascular:  Negative for chest pain, palpitations, orthopnea and claudication.  Gastrointestinal:  Negative for abdominal pain, blood in stool, constipation, diarrhea, heartburn, melena, nausea and vomiting.  Genitourinary:  Negative for dysuria, flank pain, frequency, hematuria and urgency.  Musculoskeletal:  Negative for back pain, joint pain and myalgias.  Skin:  Negative for rash.  Neurological:  Negative for dizziness, tingling, focal weakness, seizures, weakness and headaches.  Endo/Heme/Allergies:  Does not bruise/bleed easily.  Psychiatric/Behavioral:  Negative for depression and suicidal ideas. The patient does not have insomnia.    No Known Allergies  Patient Active Problem List   Diagnosis Date Noted   Hyperlipidemia 04/24/2021   Goals of care, counseling/discussion 04/24/2021   Prostate cancer [facility])     Past Medical History:  Diagnosis Date   Diabetes mellitus, type 2 ([place])    Erectile dysfunction    Exogenous obesity    Hx of gout    Hyperlipidemia    Hypertension    Hypokalemia    Prostate cancer ([place])     No past surgical history on file.  Social History   Socioeconomic History   Marital status: Unknown     Spouse name: Not on file   Number of children: Not on file   Years of education: Not on file   Highest education level: Not on file  Occupational History   Not on file  Tobacco Use   Smoking status: Former    Types: Cigarettes   Smokeless tobacco: Never  Vaping Use   Vaping Use: Never used  Substance and Sexual Activity   Alcohol use: Not on file   Drug use: Not on file   Sexual activity: Not on file  Other Topics Concern   Not on file  Social History Narrative   Not on file   Social Determinants of Health   Financial Resource Strain: Not on file  Food Insecurity: Not on file  Transportation Needs: Not on file  Physical Activity: Not on file  Stress: Not on file  Social Connections: Not on file  Intimate Partner Violence: Not on file     Family History  Problem Relation Age of Onset   Diabetes Father      Current Outpatient Medications:    amLODipine (NORVASC) 10 MG tablet, Take 10 mg by mouth daily., Disp: , Rfl:    aspirin 81 MG EC tablet, Take by mouth., Disp: , Rfl:    atenolol (TENORMIN) 100 MG tablet, Take 100 mg by mouth daily., Disp: , Rfl:    atorvastatin (LIPITOR) 80 MG tablet, Take 80 mg by mouth at bedtime., Disp: , Rfl:    canagliflozin (INVOKANA) 100 MG TABS tablet, Take by mouth., Disp: , Rfl:    glipiZIDE (GLUCOTROL) 5 MG tablet, Take by mouth., Disp: ,  Rfl:    lisinopril-hydrochlorothiazide (ZESTORETIC) 20-12.5 MG tablet, Take 1 tablet by mouth 2 (two) times daily., Disp: , Rfl:    metFORMIN (GLUCOPHAGE) 1000 MG tablet, Take 1,000 mg by mouth 2 (two) times daily., Disp: , Rfl:    niacin (SLO-NIACIN) 500 MG tablet, Take by mouth., Disp: , Rfl:    potassium chloride (KLOR-CON) 10 MEQ tablet, Take 10 mEq by mouth daily., Disp: , Rfl:    potassium chloride (KLOR-CON) 10 MEQ tablet, Take by mouth., Disp: , Rfl:    Physical exam:  There were no vitals filed for this visit.  Physical Exam Constitutional:      General: He is not in acute  distress. Cardiovascular:     Rate and Rhythm: Normal rate and regular rhythm.     Heart sounds: Normal heart sounds.  Pulmonary:     Effort: Pulmonary effort is normal.     Breath sounds: Normal breath sounds.  Abdominal:     General: Bowel sounds are normal.     Palpations: Abdomen is soft.  Skin:    General: Skin is warm and dry.  Neurological:     Mental Status: He is alert and oriented to person, place, and time.       CMP Latest Ref Rng & Units 06/26/2021  Glucose 70 - 99 mg/dL 139(H)  BUN 8 - 23 mg/dL 23  Creatinine 0.61 - 1.24 mg/dL 1.14  Sodium 135 - 145 mmol/L 137  Potassium 3.5 - 5.1 mmol/L 3.9  Chloride 98 - 111 mmol/L 96(L)  CO2 22 - 32 mmol/L 31  Calcium 8.9 - 10.3 mg/dL 9.2  Total Protein 6.5 - 8.1 g/dL 7.0  Total Bilirubin 0.3 - 1.2 mg/dL 1.0  Alkaline Phos 38 - 126 U/L 58  AST 15 - 41 U/L 23  ALT 0 - 44 U/L 14   CBC Latest Ref Rng & Units 06/26/2021  WBC 4.0 - 10.5 K/uL 8.4  Hemoglobin 13.0 - 17.0 g/dL 13.2  Hematocrit 39.0 - 52.0 % 37.9(L)  Platelets 150 - 400 K/uL 242      Assessment and plan- Patient is a 80 y.o. 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   At previous visit we reviewed to define biochemical recurrence after radiation, he would need to have PSA greater than 2.  Most recent PSA was 1.6 and he lacked high risk features for early recurrence.  Therefore, it was recommended to monitor PSA conservatively without starting any antiandrogen therapy.   PSA is pending at time of visit.  Again reviewed that if there is a consistent rise in his PSA would obtain staging scans including a bone scan.  Unlikely a candidate for salvage prostatectomy given his comorbidities and performance status.  Could consider antiandrogen therapy but I would hold off unless he has a confirmed  biochemical recurrence as this may impact his quality of life and potential for other side effects.  Thank you for this kind referral and the opportunity to participate in the care of this patient   Visit Diagnosis 1. Encounter for follow-up surveillance of prostate cancer    Beckey Rutter, [place], AGNP-C [place] at [facility] [phone] (clinic) 08/30/21 10:11 AM

## 2021-08-30 NOTE — Progress Notes (Signed)
Patient is on ABT for upper respiratory infection negative for flu and covid.

## 2021-09-01 ENCOUNTER — Telehealth: Payer: Self-pay | Admitting: Internal Medicine

## 2021-09-01 ENCOUNTER — Telehealth: Payer: Self-pay | Admitting: *Deleted

## 2021-09-01 DIAGNOSIS — C61 Malignant neoplasm of prostate: Secondary | ICD-10-CM

## 2021-09-01 NOTE — Telephone Encounter (Signed)
I spoke to patient regarding results of the PSA; in agreement with PET scan.  Wants to have the scans done about a month from now/holidays and also infection.  Awaiting CT scan.   B- follow-up in in middle of January 2023- MD; no labs-PET scan prior.Dr.B

## 2021-09-01 NOTE — Telephone Encounter (Signed)
Patient asking for results of PSA which has more than doubled. Please advise,  PSA Order: [id number] Status: Final result    Visible to patient: No (inaccessible in [place])    Next appt: None    Dx: Prostate cancer ([place])    1 Result Note Component Ref Range & Units 2 d ago 2 mo ago  Prostatic Specific Antigen 0.00 - 4.00 ng/mL 3.97  1.65 CM   Comment: (NOTE)  While PSA levels of <=4.0 ng/ml are reported as reference range, some  men with levels below 4.0 ng/ml can have prostate cancer and many men  with PSA above 4.0 ng/ml do not have prostate cancer.  Other tests  such as free PSA, age specific reference ranges, PSA velocity and PSA  doubling time may be helpful especially in men less than 59 years  old.  Performed at [facility], [place] [address], [place], [place]  [address]   Resulting Agency  [facility]         Specimen Collected: 08/30/21 10:02 Last Resulted: 08/30/21 13:40

## 2021-09-01 NOTE — Telephone Encounter (Signed)
Did he transfer to you?

## 2021-10-11 ENCOUNTER — Ambulatory Visit
Admission: RE | Admit: 2021-10-11 | Discharge: 2021-10-11 | Disposition: A | Discharge status: Home / Self Care | Payer: Medicare Other | Source: Ambulatory Visit | Attending: Internal Medicine | Admitting: Internal Medicine

## 2021-10-11 DIAGNOSIS — I700 Atherosclerosis of aorta: Secondary | ICD-10-CM | POA: Insufficient documentation

## 2021-10-11 DIAGNOSIS — C61 Malignant neoplasm of prostate: Secondary | ICD-10-CM | POA: Insufficient documentation

## 2021-10-11 DIAGNOSIS — R918 Other nonspecific abnormal finding of lung field: Secondary | ICD-10-CM | POA: Insufficient documentation

## 2021-10-11 MED ORDER — PIFLIFOLASTAT F 18 (PYLARIFY) INJECTION
9.0000 | Freq: Once | INTRAVENOUS | Status: AC
  Administered 2021-10-11: 9.4 via INTRAVENOUS

## 2021-10-18 ENCOUNTER — Other Ambulatory Visit: Payer: Self-pay

## 2021-10-18 ENCOUNTER — Inpatient Hospital Stay: Payer: Medicare Other | Attending: Internal Medicine | Admitting: Internal Medicine

## 2021-10-18 ENCOUNTER — Encounter: Payer: Self-pay | Admitting: Internal Medicine

## 2021-10-18 DIAGNOSIS — M2550 Pain in unspecified joint: Secondary | ICD-10-CM | POA: Insufficient documentation

## 2021-10-18 DIAGNOSIS — Z79899 Other long term (current) drug therapy: Secondary | ICD-10-CM | POA: Insufficient documentation

## 2021-10-18 DIAGNOSIS — C778 Secondary and unspecified malignant neoplasm of lymph nodes of multiple regions: Secondary | ICD-10-CM | POA: Diagnosis not present

## 2021-10-18 DIAGNOSIS — M549 Dorsalgia, unspecified: Secondary | ICD-10-CM | POA: Diagnosis not present

## 2021-10-18 DIAGNOSIS — Z833 Family history of diabetes mellitus: Secondary | ICD-10-CM | POA: Insufficient documentation

## 2021-10-18 DIAGNOSIS — E785 Hyperlipidemia, unspecified: Secondary | ICD-10-CM | POA: Insufficient documentation

## 2021-10-18 DIAGNOSIS — R350 Frequency of micturition: Secondary | ICD-10-CM | POA: Diagnosis not present

## 2021-10-18 DIAGNOSIS — R059 Cough, unspecified: Secondary | ICD-10-CM | POA: Diagnosis not present

## 2021-10-18 DIAGNOSIS — R0602 Shortness of breath: Secondary | ICD-10-CM | POA: Insufficient documentation

## 2021-10-18 DIAGNOSIS — R972 Elevated prostate specific antigen [PSA]: Secondary | ICD-10-CM | POA: Diagnosis not present

## 2021-10-18 DIAGNOSIS — R918 Other nonspecific abnormal finding of lung field: Secondary | ICD-10-CM | POA: Diagnosis not present

## 2021-10-18 DIAGNOSIS — C61 Malignant neoplasm of prostate: Secondary | ICD-10-CM

## 2021-10-18 DIAGNOSIS — Z87891 Personal history of nicotine dependence: Secondary | ICD-10-CM | POA: Insufficient documentation

## 2021-10-18 NOTE — Assessment & Plan Note (Addendum)
#  Recurrent-castrate sensitive prostate cancer/stage IV- PET JAN 11th,  2023- JAN- Solitary enlarged radiotracer avid RIGHT external iliac lymph node consistent with prostate cancer nodal metastasis;  No evidence of local recurrence within the prostate gland/or metastatic disease.PSA December 2022-3.96.  #I had a long discussion with patient regarding natural history of metastatic prostate cancer/unfortunately cannot be cured.  However, patient with solitary metastatic lymph node-likely "years" with ADT therapy.  #I had a long discussion regarding the concerning findings progressive disease.  Again discussed at length the importance of starting ADT-to treat his progressive prostate cancer.  I reviewed the mechanism of action of ADT/blocking testosterone.  Again reviewed the potential side effects including but not limited to-fatigue hot flashes loss of libido.  Also reviewed that bone health/cardiovascular as long-term complications.  I would recommend Eligard.  Patient understands treatments are palliative not curative. Discussed treatments are in general indefinite; however treatment breaks were given based upon tolerance preference.    #Increased frequency of urination-chronic; stable as per patient.  Does not want any pharmacotherapy.  #Pneumonia December 2022-PET scan [for prostate]-shows no progression or worsening disease.  Defer to follow-up/  # DISPOSITION: # Eligard in 2 weeks # follow up in 4 months; MD- 1 week prior-labs- ;PSA-Dr.B  # 40 minutes face-to-face with the patient discussing the above plan of care; more than 50% of time spent on prognosis/ natural history; counseling and coordination.

## 2021-10-18 NOTE — Progress Notes (Signed)
Would like results of PET scan

## 2021-10-18 NOTE — Progress Notes (Signed)
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  His pretreatment PSA was 9.  S/p  IMRT to his prostate.  He has never undergone radical prostatectomy.His PSA subsequently went down to 0.2 in 2015.  More recently when patient had his blood work done his PSA was elevated at 1.6 and therefore patient has been referred to [place].  It appears that patient has never received any antiandrogen therapy in the past.    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      Prostate cancer ([place])  04/24/2021 Initial Diagnosis   Prostate cancer ([place])   04/24/2021 Cancer Staging   Staging form: Prostate, AJCC 8th Edition - Clinical stage from 04/24/2021: Stage IIB (cT1c, cN0, cM0, PSA: 9, Grade Group: 2) - Signed by Sindy Guadeloupe, MD on 04/24/2021 Prostate specific antigen (PSA) range: Less than 10 Gleason score: 7 Histologic grading system: 5 grade system    10/18/2021 Cancer Staging   Staging form: Prostate, AJCC 8th Edition - Pathologic: Stage IVB (pM1a) - Signed by Cammie Sickle, MD on 10/18/2021       HISTORY OF PRESENTING ILLNESS:  Devon Anderson 81 y.o.  male above history of prostate cancer is here for follow-up/review results of the PET scan.  Denies any nausea vomiting abdominal pain.  No fever no chills.   Shortness of breath cough improved s/p treatment for pneumonia.   Review of Systems  Constitutional:  Negative for chills, diaphoresis, fever, malaise/fatigue and weight loss.  HENT:  Negative for nosebleeds and sore throat.   Eyes:  Negative for double vision.   Respiratory:  Negative for cough, hemoptysis and sputum production.   Cardiovascular:  Negative for chest pain, palpitations, orthopnea and leg swelling.  Gastrointestinal:  Negative for abdominal pain, blood in stool, constipation, diarrhea, heartburn, melena, nausea and vomiting.  Genitourinary:  Negative for dysuria, frequency and urgency.  Musculoskeletal:  Positive for back pain and joint pain.  Skin: Negative.  Negative for itching and rash.  Neurological:  Negative for dizziness, tingling, focal weakness, weakness and headaches.  Endo/Heme/Allergies:  Does not bruise/bleed easily.  Psychiatric/Behavioral:  Negative for depression. The patient is not nervous/anxious and does not have insomnia.     MEDICAL HISTORY:  Past Medical History:  Diagnosis Date   Diabetes mellitus, type 2 ([place])    Erectile dysfunction    Exogenous obesity    Hx of gout    Hyperlipidemia    Hypertension    Hypokalemia    Prostate cancer ([place])     SURGICAL HISTORY: History reviewed. No pertinent surgical history.  SOCIAL HISTORY: Social History   Socioeconomic History   Marital status: Unknown    Spouse name: Not on file   Number of children: Not on file   Years of education: Not on file   Highest education level: Not on file  Occupational History   Not on file  Tobacco Use   Smoking status: Former    Types: Cigarettes   Smokeless tobacco: Never  Vaping Use   Vaping Use: Never used  Substance and Sexual Activity   Alcohol use:  Not on file   Drug use: Not on file   Sexual activity: Not on file  Other Topics Concern   Not on file  Social History Narrative   Not on file   Social Determinants of Health   Financial Resource Strain: Not on file  Food Insecurity: Not on file  Transportation Needs: Not on file  Physical Activity: Not on file  Stress: Not on file  Social Connections: Not on file  Intimate Partner Violence: Not on file    FAMILY HISTORY: Family History  Problem  Relation Age of Onset   Diabetes Father     ALLERGIES:  has No Known Allergies.  MEDICATIONS:  Current Outpatient Medications  Medication Sig Dispense Refill   amLODipine (NORVASC) 10 MG tablet Take 10 mg by mouth daily.     aspirin 81 MG EC tablet Take by mouth.     atenolol (TENORMIN) 100 MG tablet Take 100 mg by mouth daily.     atorvastatin (LIPITOR) 80 MG tablet Take 80 mg by mouth at bedtime.     canagliflozin (INVOKANA) 100 MG TABS tablet Take by mouth.     glipiZIDE (GLUCOTROL) 5 MG tablet Take by mouth.     lisinopril-hydrochlorothiazide (ZESTORETIC) 20-12.5 MG tablet Take 1 tablet by mouth 2 (two) times daily.     metFORMIN (GLUCOPHAGE) 1000 MG tablet Take 1,000 mg by mouth 2 (two) times daily.     niacin (SLO-NIACIN) 500 MG tablet Take by mouth.     potassium chloride (KLOR-CON) 10 MEQ tablet Take 10 mEq by mouth daily.     potassium chloride (KLOR-CON) 10 MEQ tablet Take by mouth.     No current facility-administered medications for this visit.      Marland Kitchen  PHYSICAL EXAMINATION:  Vitals:   10/18/21 1410  BP: (!) 181/79  Pulse: 83  Temp: 98.2 F (36.8 C)  SpO2: 100%   Filed Weights   10/18/21 1410  Weight: 201 lb 12.8 oz (91.5 kg)    Physical Exam Vitals and nursing note reviewed.  HENT:     Head: Normocephalic and atraumatic.     Mouth/Throat:     Pharynx: Oropharynx is clear.  Eyes:     Extraocular Movements: Extraocular movements intact.     Pupils: Pupils are equal, round, and reactive to light.  Cardiovascular:     Rate and Rhythm: Normal rate and regular rhythm.  Pulmonary:     Comments: Decreased breath sounds bilaterally.  Abdominal:     Palpations: Abdomen is soft.  Musculoskeletal:        General: Normal range of motion.     Cervical back: Normal range of motion.  Skin:    General: Skin is warm.  Neurological:     General: No focal deficit present.     Mental Status: He is alert and oriented to person, place, and time.  Psychiatric:         Behavior: Behavior normal.        Judgment: Judgment normal.     LABORATORY DATA:  I have reviewed the data as listed Lab Results  Component Value Date   WBC 10.8 (H) 08/30/2021   HGB 13.2 08/30/2021   HCT 38.0 (L) 08/30/2021   MCV 84.1 08/30/2021   PLT 426 (H) 08/30/2021   Recent Labs    06/26/21 0939 08/30/21 1002  NA 137 132*  K 3.9 3.2*  CL 96* 93*  CO2 31 29  GLUCOSE 139* 145*  BUN 23 18  CREATININE  1.14 0.95  CALCIUM 9.2 8.8*  GFRNONAA >60 >60  PROT 7.0 7.7  ALBUMIN 4.0 3.0*  AST 23 67*  ALT 14 91*  ALKPHOS 58 93  BILITOT 1.0 0.7    RADIOGRAPHIC STUDIES: I have personally reviewed the radiological images as listed and agreed with the findings in the report. NM PET (PSMA) SKULL TO MID THIGH  Result Date: 10/11/2021 CLINICAL DATA:  Prostate cancer diagnosed 2013. Radiation therapy in seed implantation. EXAM: NUCLEAR MEDICINE PET SKULL BASE TO THIGH TECHNIQUE: 9.4 mCi F18 Piflufolastat (Pylarify) was injected intravenously. Full-ring PET imaging was performed from the skull base to thigh after the radiotracer. CT data was obtained and used for attenuation correction and anatomic localization. COMPARISON:  None. FINDINGS: NECK No radiotracer activity in neck lymph nodes. Incidental CT finding: None CHEST There are linear bands of pleuroparenchymal thickening with mild nodularity in the RIGHT upper lobe and to lesser degree the RIGHT lower lobe. There is mild radiotracer activity. Example parenchymal band in the RIGHT upper lobe on image 104 with SUV max 2.5. Ill-defined nodularity in the RIGHT lower lobe on image 118 without radiotracer activity. Incidental CT finding: None ABDOMEN/PELVIS Prostate: No focal activity the prostate gland. Lymph nodes: 10 mm RIGHT external iliac lymph node (image 237/CT series 3) has intense radiotracer activity with SUV max equal 8.7 Liver: No evidence of liver metastasis Incidental CT finding: Atherosclerotic calcification of the aorta.  SKELETON No focal  activity to suggest skeletal metastasis. IMPRESSION: 1. Solitary enlarged radiotracer avid RIGHT external iliac lymph node consistent with prostate cancer nodal metastasis. 2. No evidence of local recurrence within the prostate gland. 3. No additional evidence of metastatic adenopathy. 4. No evidence skeletal metastasis or visceral metastasis 5. Bands of parenchymal thickening and nodularity with mild radiotracer activity in the RIGHT lung are favored benign post inflammatory or infectious. Electronically Signed   By: Suzy Bouchard M.D.   On: 10/11/2021 17:01    Prostate cancer ([place]) #Recurrent-castrate sensitive prostate cancer/stage IV- PET JAN 11th,  2023- JAN- Solitary enlarged radiotracer avid RIGHT external iliac lymph node consistent with prostate cancer nodal metastasis;  No evidence of local recurrence within the prostate gland/or metastatic disease. PSA December 2022-3.96.  #I had a long discussion with patient regarding natural history of metastatic prostate cancer/unfortunately cannot be cured.  However, patient with solitary metastatic lymph node-likely "years" with ADT therapy.  #I had a long discussion regarding the concerning findings progressive disease.  Again discussed at length the importance of starting ADT-to treat his progressive prostate cancer.  I reviewed the mechanism of action of ADT/blocking testosterone.  Again reviewed the potential side effects including but not limited to-fatigue hot flashes loss of libido.  Also reviewed that bone health/cardiovascular as long-term complications.  I would recommend Eligard.  Patient understands treatments are palliative not curative. Discussed treatments are in general indefinite; however treatment breaks were given based upon tolerance preference.    #Increased frequency of urination-chronic; stable as per patient.  Does not want any pharmacotherapy.  #Pneumonia December 2022-PET scan [for prostate]-shows no  progression or worsening disease.  Defer to follow-up/   # DISPOSITION: # Eligard in 2 weeks # follow up in 4 months; MD- 1 week prior-labs- ;PSA-Dr.B  # 40 minutes face-to-face with the patient discussing the above plan of care; more than 50% of time spent on prognosis/ natural history; counseling and coordination.   All questions were answered. The patient knows to call the clinic with any problems, questions or concerns.  Cammie Sickle, MD 10/19/2021 2:49 PM

## 2021-10-19 ENCOUNTER — Encounter: Payer: Self-pay | Admitting: Internal Medicine

## 2021-11-01 ENCOUNTER — Other Ambulatory Visit: Payer: Self-pay

## 2021-11-01 ENCOUNTER — Inpatient Hospital Stay: Payer: Medicare Other | Attending: Internal Medicine

## 2021-11-01 ENCOUNTER — Ambulatory Visit: Payer: Medicare Other

## 2021-11-01 DIAGNOSIS — Z5111 Encounter for antineoplastic chemotherapy: Secondary | ICD-10-CM | POA: Diagnosis not present

## 2021-11-01 DIAGNOSIS — C61 Malignant neoplasm of prostate: Secondary | ICD-10-CM | POA: Diagnosis present

## 2021-11-01 MED ORDER — LEUPROLIDE ACETATE (6 MONTH) 45 MG ~~LOC~~ KIT
45.0000 mg | PACK | Freq: Once | SUBCUTANEOUS | Status: AC
  Administered 2021-11-01: 45 mg via SUBCUTANEOUS
  Filled 2021-11-01: qty 45

## 2022-01-09 ENCOUNTER — Other Ambulatory Visit: Payer: Medicare Other

## 2022-01-16 ENCOUNTER — Ambulatory Visit: Payer: Medicare Other | Admitting: Internal Medicine

## 2022-02-05 ENCOUNTER — Other Ambulatory Visit: Payer: Self-pay

## 2022-02-05 DIAGNOSIS — C61 Malignant neoplasm of prostate: Secondary | ICD-10-CM

## 2022-02-06 ENCOUNTER — Inpatient Hospital Stay: Payer: Medicare Other | Attending: Internal Medicine

## 2022-02-06 DIAGNOSIS — C61 Malignant neoplasm of prostate: Secondary | ICD-10-CM | POA: Diagnosis present

## 2022-02-06 DIAGNOSIS — E1122 Type 2 diabetes mellitus with diabetic chronic kidney disease: Secondary | ICD-10-CM | POA: Diagnosis not present

## 2022-02-06 DIAGNOSIS — Z79899 Other long term (current) drug therapy: Secondary | ICD-10-CM | POA: Diagnosis not present

## 2022-02-06 DIAGNOSIS — N189 Chronic kidney disease, unspecified: Secondary | ICD-10-CM | POA: Insufficient documentation

## 2022-02-06 DIAGNOSIS — R232 Flushing: Secondary | ICD-10-CM | POA: Insufficient documentation

## 2022-02-06 DIAGNOSIS — M549 Dorsalgia, unspecified: Secondary | ICD-10-CM | POA: Diagnosis not present

## 2022-02-06 DIAGNOSIS — Z833 Family history of diabetes mellitus: Secondary | ICD-10-CM | POA: Insufficient documentation

## 2022-02-06 DIAGNOSIS — E785 Hyperlipidemia, unspecified: Secondary | ICD-10-CM | POA: Insufficient documentation

## 2022-02-06 DIAGNOSIS — R5383 Other fatigue: Secondary | ICD-10-CM | POA: Diagnosis not present

## 2022-02-06 DIAGNOSIS — R350 Frequency of micturition: Secondary | ICD-10-CM | POA: Insufficient documentation

## 2022-02-06 DIAGNOSIS — M2550 Pain in unspecified joint: Secondary | ICD-10-CM | POA: Diagnosis not present

## 2022-02-06 DIAGNOSIS — I129 Hypertensive chronic kidney disease with stage 1 through stage 4 chronic kidney disease, or unspecified chronic kidney disease: Secondary | ICD-10-CM | POA: Diagnosis not present

## 2022-02-06 DIAGNOSIS — Z87891 Personal history of nicotine dependence: Secondary | ICD-10-CM | POA: Insufficient documentation

## 2022-02-06 LAB — CBC WITH DIFFERENTIAL/PLATELET
Abs Immature Granulocytes: 0.03 10*3/uL (ref 0.00–0.07)
Basophils Absolute: 0 10*3/uL (ref 0.0–0.1)
Basophils Relative: 1 %
Eosinophils Absolute: 0.6 10*3/uL — ABNORMAL HIGH (ref 0.0–0.5)
Eosinophils Relative: 7 %
HCT: 35.6 % — ABNORMAL LOW (ref 39.0–52.0)
Hemoglobin: 12.1 g/dL — ABNORMAL LOW (ref 13.0–17.0)
Immature Granulocytes: 0 %
Lymphocytes Relative: 19 %
Lymphs Abs: 1.7 10*3/uL (ref 0.7–4.0)
MCH: 29.3 pg (ref 26.0–34.0)
MCHC: 34 g/dL (ref 30.0–36.0)
MCV: 86.2 fL (ref 80.0–100.0)
Monocytes Absolute: 0.7 10*3/uL (ref 0.1–1.0)
Monocytes Relative: 8 %
Neutro Abs: 5.8 10*3/uL (ref 1.7–7.7)
Neutrophils Relative %: 65 %
Platelets: 300 10*3/uL (ref 150–400)
RBC: 4.13 MIL/uL — ABNORMAL LOW (ref 4.22–5.81)
RDW: 13.2 % (ref 11.5–15.5)
WBC: 8.9 10*3/uL (ref 4.0–10.5)
nRBC: 0 % (ref 0.0–0.2)

## 2022-02-06 LAB — COMPREHENSIVE METABOLIC PANEL
ALT: 22 U/L (ref 0–44)
AST: 22 U/L (ref 15–41)
Albumin: 3.8 g/dL (ref 3.5–5.0)
Alkaline Phosphatase: 66 U/L (ref 38–126)
Anion gap: 10 (ref 5–15)
BUN: 28 mg/dL — ABNORMAL HIGH (ref 8–23)
CO2: 28 mmol/L (ref 22–32)
Calcium: 9.2 mg/dL (ref 8.9–10.3)
Chloride: 96 mmol/L — ABNORMAL LOW (ref 98–111)
Creatinine, Ser: 1.28 mg/dL — ABNORMAL HIGH (ref 0.61–1.24)
GFR, Estimated: 57 mL/min — ABNORMAL LOW (ref >60)
Glucose, Bld: 128 mg/dL — ABNORMAL HIGH (ref 70–99)
Potassium: 3.8 mmol/L (ref 3.5–5.1)
Sodium: 134 mmol/L — ABNORMAL LOW (ref 135–145)
Total Bilirubin: 0.9 mg/dL (ref 0.3–1.2)
Total Protein: 7.4 g/dL (ref 6.5–8.1)

## 2022-02-06 LAB — PSA: Prostatic Specific Antigen: 0.01 ng/mL (ref 0.00–4.00)

## 2022-02-13 ENCOUNTER — Inpatient Hospital Stay: Payer: Medicare Other | Admitting: Internal Medicine

## 2022-02-13 ENCOUNTER — Encounter: Payer: Self-pay | Admitting: Internal Medicine

## 2022-02-13 DIAGNOSIS — C61 Malignant neoplasm of prostate: Secondary | ICD-10-CM

## 2022-02-13 NOTE — Progress Notes (Signed)
Having hot flashes and not feeling as good since hormone injection. ? ?Asking if treatment is working. ? ?

## 2022-02-13 NOTE — Progress Notes (Signed)
Devon Anderson ?CONSULT NOTE ? ?Patient Care Team: ?Driggers, Sharen Hint as PCP - General (Physician Assistant) ? ?CHIEF COMPLAINTS/PURPOSE OF CONSULTATION: PROSTATE CANCER ?Oncology History Overview Note  ? 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  His pretreatment PSA was 9.  S/p  IMRT to his prostate.  He has never undergone radical prostatectomy.His PSA subsequently went down to 0.2 in 2015.  More recently when patient had his blood work done his PSA was elevated at 1.6 and therefore patient has been referred to [place].  It appears that patient has never received any antiandrogen therapy in the past.  ? ? 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 ?# MID FEB 2023- START eligard q6M.  ? ? ?  ?Prostate cancer ([place])  ?04/24/2021 Initial Diagnosis  ? Prostate cancer ([place]) ? ?  ?04/24/2021 Cancer Staging  ? Staging form: Prostate, AJCC 8th Edition ?- Clinical stage from 04/24/2021: Stage IIB (cT1c, cN0, cM0, PSA: 9, Grade Group: 2) - Signed by Sindy Guadeloupe, MD on 04/24/2021 ?Prostate specific antigen (PSA) range: Less than 10 ?Gleason score: 7 ?Histologic grading system: 5 grade system ? ?  ?10/18/2021 Cancer Staging  ? Staging form: Prostate, AJCC 8th Edition ?- Pathologic: Stage IVB (pM1a) - Signed by Cammie Sickle, MD on 10/18/2021 ? ?  ? ? ? ?HISTORY OF PRESENTING ILLNESS: Alone.  Ambulating independently. ? ?Devon Anderson 81 y.o.  male above history of castrate sensitive metastatic prostate cancer on eligard is here for follow-up/review results of his PSA.  ? ?Patient complains of hot flashes mild to moderate.  Complains of fatigue. ? ?Denies any nausea vomiting abdominal pain.  No fever no chills.  ? ?Review of Systems  ?Constitutional:  Positive for malaise/fatigue. Negative for chills, diaphoresis, fever and weight loss.   ?HENT:  Negative for nosebleeds and sore throat.   ?Eyes:  Negative for double vision.  ?Respiratory:  Negative for cough, hemoptysis and sputum production.   ?Cardiovascular:  Negative for chest pain, palpitations, orthopnea and leg swelling.  ?Gastrointestinal:  Negative for abdominal pain, blood in stool, constipation, diarrhea, heartburn, melena, nausea and vomiting.  ?Genitourinary:  Positive for frequency. Negative for dysuria and urgency.  ?Musculoskeletal:  Positive for back pain and joint pain.  ?Skin: Negative.  Negative for itching and rash.  ?Neurological:  Negative for dizziness, tingling, focal weakness, weakness and headaches.  ?Endo/Heme/Allergies:  Does not bruise/bleed easily.  ?Psychiatric/Behavioral:  Negative for depression. The patient is not nervous/anxious and does not have insomnia.    ? ?MEDICAL HISTORY:  ?Past Medical History:  ?Diagnosis Date  ? Diabetes mellitus, type 2 ([place])   ? Erectile dysfunction   ? Exogenous obesity   ? Hx of gout   ? Hyperlipidemia   ? Hypertension   ? Hypokalemia   ? Prostate cancer ([place])   ? ? ?SURGICAL HISTORY: ?History reviewed. No pertinent surgical history. ? ?SOCIAL HISTORY: ?Social History  ? ?Socioeconomic History  ? Marital status: Unknown  ?  Spouse name: Not on file  ? Number of children: Not on file  ? Years of education: Not on file  ? Highest education level: Not on file  ?Occupational History  ? Not on file  ?Tobacco Use  ? Smoking status: Former  ?  Types: Cigarettes  ? Smokeless tobacco: Never  ?Vaping  Use  ? Vaping Use: Never used  ?Substance and Sexual Activity  ? Alcohol use: Not on file  ? Drug use: Not on file  ? Sexual activity: Not on file  ?Other Topics Concern  ? Not on file  ?Social History Narrative  ? Not on file  ? ?Social Determinants of Health  ? ?Financial Resource Strain: Not on file  ?Food Insecurity: Not on file  ?Transportation Needs: Not on file  ?Physical Activity: Not on file  ?Stress: Not on file  ?Social Connections: Not  on file  ?Intimate Partner Violence: Not on file  ? ? ?FAMILY HISTORY: ?Family History  ?Problem Relation Age of Onset  ? Diabetes Father   ? ? ?ALLERGIES:  has No Known Allergies. ? ?MEDICATIONS:  ?Current Outpatient Medications  ?Medication Sig Dispense Refill  ? amLODipine (NORVASC) 10 MG tablet Take 10 mg by mouth daily.    ? aspirin 81 MG EC tablet Take by mouth.    ? atenolol (TENORMIN) 100 MG tablet Take 100 mg by mouth daily.    ? atorvastatin (LIPITOR) 80 MG tablet Take 80 mg by mouth at bedtime.    ? glipiZIDE (GLUCOTROL) 5 MG tablet Take by mouth.    ? lisinopril-hydrochlorothiazide (ZESTORETIC) 20-12.5 MG tablet Take 1 tablet by mouth 2 (two) times daily.    ? metFORMIN (GLUCOPHAGE) 1000 MG tablet Take 1,000 mg by mouth 2 (two) times daily.    ? niacin (SLO-NIACIN) 500 MG tablet Take by mouth.    ? potassium chloride (KLOR-CON) 10 MEQ tablet Take 10 mEq by mouth daily.    ? potassium chloride (KLOR-CON) 10 MEQ tablet Take by mouth.    ? ?No current facility-administered medications for this visit.  ? ? ?  ?. ? ?PHYSICAL EXAMINATION: ? ?Vitals:  ? 02/13/22 1330  ?BP: (!) 171/65  ?Pulse: 62  ?Temp: 98.9 ?F (37.2 ?C)  ?SpO2: 99%  ? ?Filed Weights  ? 02/13/22 1330  ?Weight: 199 lb 3.2 oz (90.4 kg)  ? ? ?Physical Exam ?Vitals and nursing note reviewed.  ?HENT:  ?   Head: Normocephalic and atraumatic.  ?   Mouth/Throat:  ?   Pharynx: Oropharynx is clear.  ?Eyes:  ?   Extraocular Movements: Extraocular movements intact.  ?   Pupils: Pupils are equal, round, and reactive to light.  ?Cardiovascular:  ?   Rate and Rhythm: Normal rate and regular rhythm.  ?Pulmonary:  ?   Comments: Decreased breath sounds bilaterally.  ?Abdominal:  ?   Palpations: Abdomen is soft.  ?Musculoskeletal:     ?   General: Normal range of motion.  ?   Cervical back: Normal range of motion.  ?Skin: ?   General: Skin is warm.  ?Neurological:  ?   General: No focal deficit present.  ?   Mental Status: He is alert and oriented to person,  place, and time.  ?Psychiatric:     ?   Behavior: Behavior normal.     ?   Judgment: Judgment normal.  ? ? ? ?LABORATORY DATA:  ?I have reviewed the data as listed ?Lab Results  ?Component Value Date  ? WBC 8.9 02/06/2022  ? HGB 12.1 (L) 02/06/2022  ? HCT 35.6 (L) 02/06/2022  ? MCV 86.2 02/06/2022  ? PLT 300 02/06/2022  ? ?Recent Labs  ?  06/26/21 ?0939 08/30/21 ?1002 02/06/22 ?1009  ?NA 137 132* 134*  ?K 3.9 3.2* 3.8  ?CL 96* 93* 96*  ?CO2 '31 29 28  '$ ?GLUCOSE  139* 145* 128*  ?BUN 23 18 28*  ?CREATININE 1.14 0.95 1.28*  ?CALCIUM 9.2 8.8* 9.2  ?GFRNONAA >60 >60 57*  ?PROT 7.0 7.7 7.4  ?ALBUMIN 4.0 3.0* 3.8  ?AST 23 67* 22  ?ALT 14 91* 22  ?ALKPHOS 58 93 66  ?BILITOT 1.0 0.7 0.9  ? ? ?RADIOGRAPHIC STUDIES: ?I have personally reviewed the radiological images as listed and agreed with the findings in the report. ?No results found. ? ?Prostate cancer ([place]) ?#Recurrent-castrate sensitive prostate cancer/stage IV- PET JAN 11th,  2023- JAN- Solitary enlarged radiotracer avid RIGHT external iliac lymph node consistent with prostate cancer nodal metastasis;  No evidence of local recurrence within the prostate gland/or metastatic disease. PSA December 2022-3.96. ? ?# Status post Eligard in [facility] Feb 2023 [45 mg q6M]-PSA today is 0.01 I reviewed the labs with the patient; and the fact that he had significant response in his PSA.  Discussed regarding intermittent ADT to make sure his quality of life is intact during these treatments.  He understands that treatments are indefinite; and not curative. ? ?# Hot flashes-G-1. Monitor for now    ? ?# ?  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  Increase hydration. ? ?# Mild anemia: monitor for now no blood loss.; will check iron studies.  At next visit.  ? ?# Increased frequency of urination-chronic; stable as per patient.   ?  ?# DISPOSITION: ?# follow up in 4 months; MD- 1 week prior-labs-cbc/cmp; iron studies;ferritin ;PSA;  possible Eligard--Dr.B ? ? ? ?All questions were answered. The patient knows to call the clinic with any problems, questions or concerns. ? ? Cammie Sickle, MD ?02/13/2022 2:07 PM ? ? ? ? ? ?

## 2022-02-13 NOTE — Addendum Note (Signed)
Addended by: Leeann Must on: 02/13/2022 02:21 PM ? ? Modules accepted: Orders ? ?

## 2022-02-13 NOTE — Assessment & Plan Note (Addendum)
#  Recurrent-castrate sensitive prostate cancer/stage IV- PET JAN 11th,  2023- JAN- Solitary enlarged radiotracer avid RIGHT external iliac lymph node consistent with prostate cancer nodal metastasis;  No evidence of local recurrence within the prostate gland/or metastatic disease.?PSA December 2022-3.96. ? ?# Status post Eligard in [facility] Feb 2023 [45 mg q6M]-PSA today is 0.01 I reviewed the labs with the patient; and the fact that he had significant response in his PSA.  Discussed regarding intermittent ADT to make sure his quality of life is intact during these treatments.  He understands that treatments are indefinite; and not curative. ? ?# Hot flashes-G-1. Monitor for now    ? ?# ?  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  Increase hydration. ? ?# Mild anemia: monitor for now no blood loss.; will check iron studies.  At next visit.  ? ?# Increased frequency of urination-chronic; stable as per patient.   ?? ?# DISPOSITION: ?# follow up in 4 months; MD- 1 week prior-labs-cbc/cmp; iron studies;ferritin ;PSA; possible Eligard--Dr.B ? ? ?

## 2022-06-11 IMAGING — CT NM PET TUM IMG SKULL BASE T - THIGH
1 of 9 series · 1 of 25 positions shown · non-contrast
Comparison: None.

CLINICAL DATA: Prostate cancer diagnosed 1804. Radiation therapy in
seed implantation.

EXAM:
NUCLEAR MEDICINE PET SKULL BASE TO THIGH
TECHNIQUE: 9.4 mCi F18 Piflufolastat (Pylarify) was injected intravenously.
Full-ring PET imaging was performed from the skull base to thigh
after the radiotracer. CT data was obtained and used for attenuation
correction and anatomic localization.

[Series 3: ct wb 5.0 b30f · axial · 5.0mm · 0.98mm/px · 1 of 329 slices shown]
[im 329/329  brain]
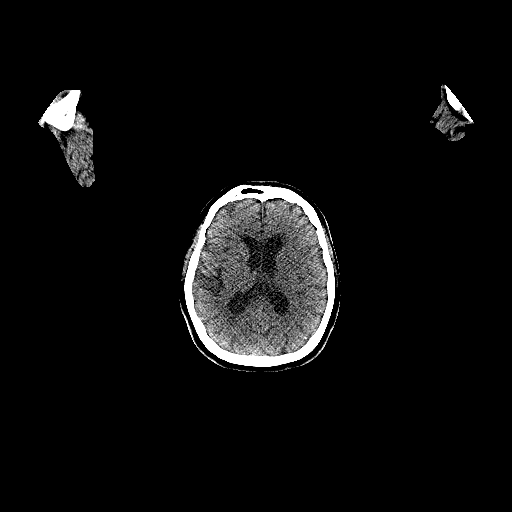

[1 of 25 positions shown; findings below may reference images not displayed]

FINDINGS: NECK

No radiotracer activity in neck lymph nodes.

Incidental CT finding: None

CHEST

There are linear bands of pleuroparenchymal thickening with mild
nodularity in the RIGHT upper lobe and to lesser degree the RIGHT
lower lobe. There is mild radiotracer activity. Example parenchymal
band in the RIGHT upper lobe on image 104 with SUV max 2.5.
Ill-defined nodularity in the RIGHT lower lobe on image 118 without
radiotracer activity.

Incidental CT finding: None

ABDOMEN/PELVIS

Prostate: No focal activity the prostate gland.

Lymph nodes: 10 mm RIGHT external iliac lymph node (image 237/CT
series 3) has intense radiotracer activity with SUV max equal

Liver: No evidence of liver metastasis

Incidental CT finding: Atherosclerotic calcification of the aorta.

SKELETON

No focal  activity to suggest skeletal metastasis.
IMPRESSION: 1. Solitary enlarged radiotracer avid RIGHT external iliac lymph
node consistent with prostate cancer nodal metastasis.
2. No evidence of local recurrence within the prostate gland.
3. No additional evidence of metastatic adenopathy.
4. No evidence skeletal metastasis or visceral metastasis
5. Bands of parenchymal thickening and nodularity with mild
radiotracer activity in the RIGHT lung are favored benign post
inflammatory or infectious.

## 2022-06-19 ENCOUNTER — Inpatient Hospital Stay: Payer: Medicare Other | Attending: Internal Medicine

## 2022-06-19 DIAGNOSIS — C61 Malignant neoplasm of prostate: Secondary | ICD-10-CM | POA: Diagnosis present

## 2022-06-19 DIAGNOSIS — R5383 Other fatigue: Secondary | ICD-10-CM | POA: Diagnosis not present

## 2022-06-19 DIAGNOSIS — Z833 Family history of diabetes mellitus: Secondary | ICD-10-CM | POA: Insufficient documentation

## 2022-06-19 DIAGNOSIS — M2550 Pain in unspecified joint: Secondary | ICD-10-CM | POA: Diagnosis not present

## 2022-06-19 DIAGNOSIS — M549 Dorsalgia, unspecified: Secondary | ICD-10-CM | POA: Insufficient documentation

## 2022-06-19 DIAGNOSIS — Z79899 Other long term (current) drug therapy: Secondary | ICD-10-CM | POA: Diagnosis not present

## 2022-06-19 DIAGNOSIS — R232 Flushing: Secondary | ICD-10-CM | POA: Diagnosis not present

## 2022-06-19 LAB — CBC WITH DIFFERENTIAL/PLATELET
Abs Immature Granulocytes: 0.02 10*3/uL (ref 0.00–0.07)
Basophils Absolute: 0 10*3/uL (ref 0.0–0.1)
Basophils Relative: 1 %
Eosinophils Absolute: 0.5 10*3/uL (ref 0.0–0.5)
Eosinophils Relative: 9 %
HCT: 39.8 % (ref 39.0–52.0)
Hemoglobin: 13.5 g/dL (ref 13.0–17.0)
Immature Granulocytes: 0 %
Lymphocytes Relative: 24 %
Lymphs Abs: 1.4 10*3/uL (ref 0.7–4.0)
MCH: 29.2 pg (ref 26.0–34.0)
MCHC: 33.9 g/dL (ref 30.0–36.0)
MCV: 86 fL (ref 80.0–100.0)
Monocytes Absolute: 0.5 10*3/uL (ref 0.1–1.0)
Monocytes Relative: 8 %
Neutro Abs: 3.6 10*3/uL (ref 1.7–7.7)
Neutrophils Relative %: 58 %
Platelets: 239 10*3/uL (ref 150–400)
RBC: 4.63 MIL/uL (ref 4.22–5.81)
RDW: 13.2 % (ref 11.5–15.5)
WBC: 6 10*3/uL (ref 4.0–10.5)
nRBC: 0 % (ref 0.0–0.2)

## 2022-06-19 LAB — IRON AND TIBC
Iron: 91 ug/dL (ref 45–182)
Saturation Ratios: 22 % (ref 17.9–39.5)
TIBC: 417 ug/dL (ref 250–450)
UIBC: 326 ug/dL

## 2022-06-19 LAB — COMPREHENSIVE METABOLIC PANEL
ALT: 20 U/L (ref 0–44)
AST: 20 U/L (ref 15–41)
Albumin: 4.2 g/dL (ref 3.5–5.0)
Alkaline Phosphatase: 63 U/L (ref 38–126)
Anion gap: 6 (ref 5–15)
BUN: 26 mg/dL — ABNORMAL HIGH (ref 8–23)
CO2: 29 mmol/L (ref 22–32)
Calcium: 9.6 mg/dL (ref 8.9–10.3)
Chloride: 101 mmol/L (ref 98–111)
Creatinine, Ser: 1.15 mg/dL (ref 0.61–1.24)
GFR, Estimated: >60 mL/min (ref >60)
Glucose, Bld: 252 mg/dL — ABNORMAL HIGH (ref 70–99)
Potassium: 3.8 mmol/L (ref 3.5–5.1)
Sodium: 136 mmol/L (ref 135–145)
Total Bilirubin: 1.1 mg/dL (ref 0.3–1.2)
Total Protein: 7.7 g/dL (ref 6.5–8.1)

## 2022-06-19 LAB — FERRITIN: Ferritin: 64 ng/mL (ref 24–336)

## 2022-06-19 LAB — PSA: Prostatic Specific Antigen: <0.01 ng/mL (ref 0.00–4.00)

## 2022-06-26 ENCOUNTER — Inpatient Hospital Stay: Payer: Medicare Other | Admitting: Internal Medicine

## 2022-06-26 ENCOUNTER — Encounter: Payer: Self-pay | Admitting: Internal Medicine

## 2022-06-26 DIAGNOSIS — C61 Malignant neoplasm of prostate: Secondary | ICD-10-CM | POA: Diagnosis not present

## 2022-06-26 NOTE — Progress Notes (Signed)
Pt in for follow up, reports having hot flashes and no energy since starting Eligard.

## 2022-06-26 NOTE — Progress Notes (Signed)
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  His pretreatment PSA was 9.  S/p  IMRT to his prostate.  He has never undergone radical prostatectomy.His PSA subsequently went down to 0.2 in 2015.  More recently when patient had his blood work done his PSA was elevated at 1.6 and therefore patient has been referred to [place].  It appears that patient has never received any antiandrogen therapy in the past.    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      Prostate cancer ([place])  04/24/2021 Initial Diagnosis   Prostate cancer ([place])   04/24/2021 Cancer Staging   Staging form: Prostate, AJCC 8th Edition - Clinical stage from 04/24/2021: Stage IIB (cT1c, cN0, cM0, PSA: 9, Grade Group: 2) - Signed by Sindy Guadeloupe, MD on 04/24/2021 Prostate specific antigen (PSA) range: Less than 10 Gleason score: 7 Histologic grading system: 5 grade system   10/18/2021 Cancer Staging   Staging form: Prostate, AJCC 8th Edition - Pathologic: Stage IVB (pM1a) - Signed by Cammie Sickle, MD on 10/18/2021      HISTORY OF PRESENTING ILLNESS: Alone.  Ambulating independently.  Devon Anderson 81 y.o.  male above history of castrate sensitive metastatic prostate cancer on eligard intermittent because of side effects is here for follow-up/review results of his PSA.   Patient complains of hot flashes mild to moderate.  Complains of ongoing fatigue.  He can continues to however walk about 20 minutes every day.  Denies any nausea vomiting abdominal pain.  No fever no chills.   Review of Systems   Constitutional:  Positive for malaise/fatigue. Negative for chills, diaphoresis, fever and weight loss.  HENT:  Negative for nosebleeds and sore throat.   Eyes:  Negative for double vision.  Respiratory:  Negative for cough, hemoptysis and sputum production.   Cardiovascular:  Negative for chest pain, palpitations, orthopnea and leg swelling.  Gastrointestinal:  Negative for abdominal pain, blood in stool, constipation, diarrhea, heartburn, melena, nausea and vomiting.  Genitourinary:  Positive for frequency. Negative for dysuria and urgency.  Musculoskeletal:  Positive for back pain and joint pain.  Skin: Negative.  Negative for itching and rash.  Neurological:  Negative for dizziness, tingling, focal weakness, weakness and headaches.  Endo/Heme/Allergies:  Does not bruise/bleed easily.  Psychiatric/Behavioral:  Negative for depression. The patient is not nervous/anxious and does not have insomnia.      MEDICAL HISTORY:  Past Medical History:  Diagnosis Date   Diabetes mellitus, type 2 ([place])    Erectile dysfunction    Exogenous obesity    Hx of gout    Hyperlipidemia    Hypertension    Hypokalemia    Prostate cancer ([place])     SURGICAL HISTORY: History reviewed. No pertinent surgical history.  SOCIAL HISTORY: Social History   Socioeconomic History   Marital status: Unknown    Spouse name: Not on file   Number of children: Not on file   Years of education: Not on file   Highest education level: Not on file  Occupational History   Not on file  Tobacco Use  Smoking status: Former    Types: Cigarettes   Smokeless tobacco: Never  Vaping Use   Vaping Use: Never used  Substance and Sexual Activity   Alcohol use: Not on file   Drug use: Not on file   Sexual activity: Not on file  Other Topics Concern   Not on file  Social History Narrative   Not on file   Social Determinants of Health   Financial Resource Strain: Not on file  Food Insecurity: Not on file   Transportation Needs: Not on file  Physical Activity: Not on file  Stress: Not on file  Social Connections: Not on file  Intimate Partner Violence: Not on file    FAMILY HISTORY: Family History  Problem Relation Age of Onset   Diabetes Father     ALLERGIES:  has No Known Allergies.  MEDICATIONS:  Current Outpatient Medications  Medication Sig Dispense Refill   amLODipine (NORVASC) 10 MG tablet Take 10 mg by mouth daily.     aspirin 81 MG EC tablet Take by mouth.     atenolol (TENORMIN) 100 MG tablet Take 100 mg by mouth daily.     atorvastatin (LIPITOR) 80 MG tablet Take 80 mg by mouth at bedtime.     docusate sodium (COLACE) 100 MG capsule Take 100 mg by mouth 2 (two) times daily.     glipiZIDE (GLUCOTROL) 5 MG tablet Take by mouth.     lisinopril-hydrochlorothiazide (ZESTORETIC) 20-12.5 MG tablet Take 1 tablet by mouth 2 (two) times daily.     metFORMIN (GLUCOPHAGE) 1000 MG tablet Take 1,000 mg by mouth 2 (two) times daily.     niacin (SLO-NIACIN) 500 MG tablet Take by mouth.     potassium chloride (KLOR-CON) 10 MEQ tablet Take 10 mEq by mouth daily.     No current facility-administered medications for this visit.      Marland Kitchen  PHYSICAL EXAMINATION:  Vitals:   06/26/22 1028  BP: (!) 155/63  Pulse: 62  Resp: 16  Temp: (!) 96.5 F (35.8 C)  SpO2: 98%   Filed Weights   06/26/22 1028  Weight: 203 lb (92.1 kg)    Physical Exam Vitals and nursing note reviewed.  HENT:     Head: Normocephalic and atraumatic.     Mouth/Throat:     Pharynx: Oropharynx is clear.  Eyes:     Extraocular Movements: Extraocular movements intact.     Pupils: Pupils are equal, round, and reactive to light.  Cardiovascular:     Rate and Rhythm: Normal rate and regular rhythm.  Pulmonary:     Comments: Decreased breath sounds bilaterally.  Abdominal:     Palpations: Abdomen is soft.  Musculoskeletal:        General: Normal range of motion.     Cervical back: Normal range of motion.   Skin:    General: Skin is warm.  Neurological:     General: No focal deficit present.     Mental Status: He is alert and oriented to person, place, and time.  Psychiatric:        Behavior: Behavior normal.        Judgment: Judgment normal.      LABORATORY DATA:  I have reviewed the data as listed Lab Results  Component Value Date   WBC 6.0 06/19/2022   HGB 13.5 06/19/2022   HCT 39.8 06/19/2022   MCV 86.0 06/19/2022   PLT 239 06/19/2022   Recent Labs    08/30/21 1002 02/06/22 1009 06/19/22 0757  NA 132* 134* 136  K 3.2* 3.8 3.8  CL 93* 96* 101  CO2 '29 28 29  '$ GLUCOSE 145* 128* 252*  BUN 18 28* 26*  CREATININE 0.95 1.28* 1.15  CALCIUM 8.8* 9.2 9.6  GFRNONAA >60 57* >60  PROT 7.7 7.4 7.7  ALBUMIN 3.0* 3.8 4.2  AST 67* 22 20  ALT 91* 22 20  ALKPHOS 93 66 63  BILITOT 0.7 0.9 1.1    RADIOGRAPHIC STUDIES: I have personally reviewed the radiological images as listed and agreed with the findings in the report. No results found.  Prostate cancer [facility]) #Recurrent-castrate sensitive prostate cancer/stage IV- PET JAN 11th,  2023- JAN- Solitary enlarged radiotracer avid RIGHT external iliac lymph node consistent with prostate cancer nodal metastasis;  No evidence of local recurrence within the prostate gland/or metastatic disease. PSA December 2022-3.96.  # Status post Eligard in [facility] Feb 2023 [45 mg q6M]-PSA today is 0.01 I reviewed the labs with the patient; and the fact that he had significant response in his PSA.  Discussed regarding intermittent ADT to make sure his quality of life is intact during these treatments.  He understands that treatments are indefinite; and not curative.  September 2023 PSA less than 0.01.  Okay to hold off Eligard today.  We will reassess again in 4 months.   # Hot flashes-G-1-2. Monitor for now     # ?  CKD -Creatnine- GFR 60- STABLE.   # Fatigue: discussed re: CARE program; declined.    # DISPOSITION: # HOLD Eligard today # follow up  in 4 months; MD- 1 week prior-labs-cbc/cmp;;PSA; possible Eligard--Dr.B    All questions were answered. The patient knows to call the clinic with any problems, questions or concerns.   Cammie Sickle, MD 06/26/2022 11:25 AM

## 2022-06-26 NOTE — Assessment & Plan Note (Addendum)
#  Recurrent-castrate sensitive prostate cancer/stage IV- PET JAN 11th,  2023- JAN- Solitary enlarged radiotracer avid RIGHT external iliac lymph node consistent with prostate cancer nodal metastasis;  No evidence of local recurrence within the prostate gland/or metastatic disease.PSA December 2022-3.96.  # Status post Eligard in [facility] Feb 2023 [45 mg q6M]-PSA MAY 2023-0.01 I reviewed the labs with the patient; and the fact that he had significant response in his PSA.  Discussed regarding intermittent ADT to make sure his quality of life is intact during these treatments.  He understands that treatments are indefinite; and not curative.  September 2023 PSA less than 0.01.  Okay to hold off Eligard today.  We will reassess again in 4 months.   # Hot flashes-G-1-2. Monitor for now     # ?  CKD -Creatnine- GFR 60- STABLE.   # Fatigue: discussed re: CARE program; declined.   # DISPOSITION: # HOLD Eligard today # follow up in 4 months; MD- 1 week prior-labs-cbc/cmp;;PSA; possible Eligard--Dr.B

## 2022-07-27 ENCOUNTER — Encounter (HOSPITAL_COMMUNITY): Payer: Self-pay

## 2022-07-27 DIAGNOSIS — E119 Type 2 diabetes mellitus without complications: Secondary | ICD-10-CM | POA: Insufficient documentation

## 2022-07-27 DIAGNOSIS — I10 Essential (primary) hypertension: Secondary | ICD-10-CM | POA: Insufficient documentation

## 2022-08-02 DIAGNOSIS — E1165 Type 2 diabetes mellitus with hyperglycemia: Secondary | ICD-10-CM | POA: Insufficient documentation

## 2022-08-02 DIAGNOSIS — K810 Acute cholecystitis: Secondary | ICD-10-CM | POA: Insufficient documentation

## 2022-08-02 DIAGNOSIS — N179 Acute kidney failure, unspecified: Secondary | ICD-10-CM | POA: Insufficient documentation

## 2022-08-02 DIAGNOSIS — E871 Hypo-osmolality and hyponatremia: Secondary | ICD-10-CM | POA: Insufficient documentation

## 2022-10-10 ENCOUNTER — Encounter: Payer: Self-pay | Admitting: Internal Medicine

## 2022-10-17 ENCOUNTER — Encounter: Payer: Self-pay | Admitting: Internal Medicine

## 2022-10-17 ENCOUNTER — Inpatient Hospital Stay: Payer: Medicare HMO | Attending: Internal Medicine

## 2022-10-17 DIAGNOSIS — Z79899 Other long term (current) drug therapy: Secondary | ICD-10-CM | POA: Diagnosis not present

## 2022-10-17 DIAGNOSIS — R232 Flushing: Secondary | ICD-10-CM | POA: Insufficient documentation

## 2022-10-17 DIAGNOSIS — Z833 Family history of diabetes mellitus: Secondary | ICD-10-CM | POA: Insufficient documentation

## 2022-10-17 DIAGNOSIS — M2550 Pain in unspecified joint: Secondary | ICD-10-CM | POA: Insufficient documentation

## 2022-10-17 DIAGNOSIS — M549 Dorsalgia, unspecified: Secondary | ICD-10-CM | POA: Insufficient documentation

## 2022-10-17 DIAGNOSIS — C61 Malignant neoplasm of prostate: Secondary | ICD-10-CM | POA: Insufficient documentation

## 2022-10-17 DIAGNOSIS — R030 Elevated blood-pressure reading, without diagnosis of hypertension: Secondary | ICD-10-CM | POA: Diagnosis not present

## 2022-10-17 DIAGNOSIS — R350 Frequency of micturition: Secondary | ICD-10-CM | POA: Diagnosis not present

## 2022-10-17 DIAGNOSIS — Z87891 Personal history of nicotine dependence: Secondary | ICD-10-CM | POA: Diagnosis not present

## 2022-10-17 DIAGNOSIS — R5383 Other fatigue: Secondary | ICD-10-CM | POA: Insufficient documentation

## 2022-10-17 DIAGNOSIS — N182 Chronic kidney disease, stage 2 (mild): Secondary | ICD-10-CM | POA: Diagnosis not present

## 2022-10-17 LAB — CBC WITH DIFFERENTIAL/PLATELET
Abs Immature Granulocytes: 0.02 10*3/uL (ref 0.00–0.07)
Basophils Absolute: 0 10*3/uL (ref 0.0–0.1)
Basophils Relative: 0 %
Eosinophils Absolute: 0.4 10*3/uL (ref 0.0–0.5)
Eosinophils Relative: 5 %
HCT: 35.4 % — ABNORMAL LOW (ref 39.0–52.0)
Hemoglobin: 12.1 g/dL — ABNORMAL LOW (ref 13.0–17.0)
Immature Granulocytes: 0 %
Lymphocytes Relative: 23 %
Lymphs Abs: 1.6 10*3/uL (ref 0.7–4.0)
MCH: 29.7 pg (ref 26.0–34.0)
MCHC: 34.2 g/dL (ref 30.0–36.0)
MCV: 87 fL (ref 80.0–100.0)
Monocytes Absolute: 0.4 10*3/uL (ref 0.1–1.0)
Monocytes Relative: 6 %
Neutro Abs: 4.7 10*3/uL (ref 1.7–7.7)
Neutrophils Relative %: 66 %
Platelets: 250 10*3/uL (ref 150–400)
RBC: 4.07 MIL/uL — ABNORMAL LOW (ref 4.22–5.81)
RDW: 13.5 % (ref 11.5–15.5)
WBC: 7.1 10*3/uL (ref 4.0–10.5)
nRBC: 0 % (ref 0.0–0.2)

## 2022-10-17 LAB — COMPREHENSIVE METABOLIC PANEL
ALT: 26 U/L (ref 0–44)
AST: 22 U/L (ref 15–41)
Albumin: 3.8 g/dL (ref 3.5–5.0)
Alkaline Phosphatase: 58 U/L (ref 38–126)
Anion gap: 9 (ref 5–15)
BUN: 22 mg/dL (ref 8–23)
CO2: 27 mmol/L (ref 22–32)
Calcium: 9.2 mg/dL (ref 8.9–10.3)
Chloride: 100 mmol/L (ref 98–111)
Creatinine, Ser: 1.25 mg/dL — ABNORMAL HIGH (ref 0.61–1.24)
GFR, Estimated: 58 mL/min — ABNORMAL LOW (ref >60)
Glucose, Bld: 197 mg/dL — ABNORMAL HIGH (ref 70–99)
Potassium: 3.4 mmol/L — ABNORMAL LOW (ref 3.5–5.1)
Sodium: 136 mmol/L (ref 135–145)
Total Bilirubin: 0.6 mg/dL (ref 0.3–1.2)
Total Protein: 7 g/dL (ref 6.5–8.1)

## 2022-10-17 LAB — PSA: Prostatic Specific Antigen: <0.01 ng/mL (ref 0.00–4.00)

## 2022-10-24 ENCOUNTER — Inpatient Hospital Stay: Payer: Medicare HMO

## 2022-10-24 ENCOUNTER — Inpatient Hospital Stay: Payer: Medicare HMO | Admitting: Internal Medicine

## 2022-10-24 ENCOUNTER — Encounter: Payer: Self-pay | Admitting: Internal Medicine

## 2022-10-24 VITALS — BP 166/78 | HR 70 | Temp 97.4°F | Resp 18 | Wt 196.5 lb

## 2022-10-24 DIAGNOSIS — C61 Malignant neoplasm of prostate: Secondary | ICD-10-CM | POA: Diagnosis not present

## 2022-10-24 MED ORDER — TAMSULOSIN HCL 0.4 MG PO CAPS: 0.4000 mg | ORAL_CAPSULE | Freq: Every day | ORAL | 1 refills | Status: AC

## 2022-10-24 NOTE — Progress Notes (Signed)
Hot flashes stable and not as frequent interested in mediation to help.  Increase in urinary frequency and would like to discuss Flomax.

## 2022-10-24 NOTE — Progress Notes (Signed)
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  His pretreatment PSA was 9.  S/p  IMRT to his prostate.  He has never undergone radical prostatectomy.His PSA subsequently went down to 0.2 in 2015.  More recently when patient had his blood work done his PSA was elevated at 1.6 and therefore patient has been referred to [place].  It appears that patient has never received any antiandrogen therapy in the past.    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      Prostate cancer ([place])  04/24/2021 Initial Diagnosis   Prostate cancer ([place])   04/24/2021 Cancer Staging   Staging form: Prostate, AJCC 8th Edition - Clinical stage from 04/24/2021: Stage IIB (cT1c, cN0, cM0, PSA: 9, Grade Group: 2) - Signed by Sindy Guadeloupe, MD on 04/24/2021 Prostate specific antigen (PSA) range: Less than 10 Gleason score: 7 Histologic grading system: 5 grade system   10/18/2021 Cancer Staging   Staging form: Prostate, AJCC 8th Edition - Pathologic: Stage IVB (pM1a) - Signed by Cammie Sickle, MD on 10/18/2021      HISTORY OF PRESENTING ILLNESS: Alone.  Ambulating independently.  Devon Anderson 82 y.o.  male above history of castrate sensitive metastatic prostate cancer on eligard intermittent because of side effects is here for follow-up/review results of his PSA.   Patient last Eligard was in February 2023.  Patient continues to have intermittent hot flashes.  Chronic mild fatigue.  Not any worse.  Noted to have increase in urinary frequency and would like to discuss Flomax.  Patient does not take  any Flomax.   Denies any nausea vomiting abdominal pain.  No fever no chills.   Review of Systems  Constitutional:  Positive for malaise/fatigue. Negative for chills, diaphoresis, fever and weight loss.  HENT:  Negative for nosebleeds and sore throat.   Eyes:  Negative for double vision.  Respiratory:  Negative for cough, hemoptysis and sputum production.   Cardiovascular:  Negative for chest pain, palpitations, orthopnea and leg swelling.  Gastrointestinal:  Negative for abdominal pain, blood in stool, constipation, diarrhea, heartburn, melena, nausea and vomiting.  Genitourinary:  Positive for frequency. Negative for dysuria and urgency.  Musculoskeletal:  Positive for back pain and joint pain.  Skin: Negative.  Negative for itching and rash.  Neurological:  Negative for dizziness, tingling, focal weakness, weakness and headaches.  Endo/Heme/Allergies:  Does not bruise/bleed easily.  Psychiatric/Behavioral:  Negative for depression. The patient is not nervous/anxious and does not have insomnia.      MEDICAL HISTORY:  Past Medical History:  Diagnosis Date   Diabetes mellitus, type 2 ([place])    Erectile dysfunction    Exogenous obesity    Hx of gout    Hyperlipidemia    Hypertension    Hypokalemia    Prostate cancer ([place])     SURGICAL HISTORY: History reviewed. No pertinent surgical history.  SOCIAL HISTORY: Social History   Socioeconomic History   Marital status: Unknown    Spouse name: Not on file   Number of children: Not on file   Years of education: Not on file  Highest education level: Not on file  Occupational History   Not on file  Tobacco Use   Smoking status: Former    Types: Cigarettes   Smokeless tobacco: Never  Vaping Use   Vaping Use: Never used  Substance and Sexual Activity   Alcohol use: Not on file   Drug use: Not on file   Sexual activity: Not on file  Other Topics Concern   Not on file  Social History Narrative   Not on file   Social  Determinants of Health   Financial Resource Strain: Not on file  Food Insecurity: Not on file  Transportation Needs: Not on file  Physical Activity: Not on file  Stress: Not on file  Social Connections: Not on file  Intimate Partner Violence: Not on file    FAMILY HISTORY: Family History  Problem Relation Age of Onset   Diabetes Father     ALLERGIES:  has No Known Allergies.  MEDICATIONS:  Current Outpatient Medications  Medication Sig Dispense Refill   amLODipine (NORVASC) 10 MG tablet Take 10 mg by mouth daily.     aspirin 81 MG EC tablet Take by mouth.     atenolol (TENORMIN) 100 MG tablet Take 100 mg by mouth daily.     atorvastatin (LIPITOR) 80 MG tablet Take 80 mg by mouth at bedtime.     docusate sodium (COLACE) 100 MG capsule Take 100 mg by mouth 2 (two) times daily.     glipiZIDE (GLUCOTROL) 5 MG tablet Take by mouth.     lisinopril-hydrochlorothiazide (ZESTORETIC) 20-12.5 MG tablet Take 1 tablet by mouth 2 (two) times daily.     Melatonin 10 MG TABS Take 10 mg by mouth at bedtime as needed.     metFORMIN (GLUCOPHAGE) 1000 MG tablet Take 1,000 mg by mouth 2 (two) times daily.     niacin (SLO-NIACIN) 500 MG tablet Take by mouth.     potassium chloride (KLOR-CON) 10 MEQ tablet Take 10 mEq by mouth daily.     tamsulosin (FLOMAX) 0.4 MG CAPS capsule Take 1 capsule (0.4 mg total) by mouth daily after supper. 90 capsule 1   No current facility-administered medications for this visit.      Marland Kitchen  PHYSICAL EXAMINATION:  Vitals:   10/24/22 0900  BP: (!) 166/78  Pulse: 70  Resp: 18  Temp: (!) 97.4 F (36.3 C)   Filed Weights   10/24/22 0900  Weight: 196 lb 8 oz (89.1 kg)    Physical Exam Vitals and nursing note reviewed.  HENT:     Head: Normocephalic and atraumatic.     Mouth/Throat:     Pharynx: Oropharynx is clear.  Eyes:     Extraocular Movements: Extraocular movements intact.     Pupils: Pupils are equal, round, and reactive to light.  Cardiovascular:      Rate and Rhythm: Normal rate and regular rhythm.  Pulmonary:     Comments: Decreased breath sounds bilaterally.  Abdominal:     Palpations: Abdomen is soft.  Musculoskeletal:        General: Normal range of motion.     Cervical back: Normal range of motion.  Skin:    General: Skin is warm.  Neurological:     General: No focal deficit present.     Mental Status: He is alert and oriented to person, place, and time.  Psychiatric:        Behavior: Behavior normal.        Judgment: Judgment normal.  LABORATORY DATA:  I have reviewed the data as listed Lab Results  Component Value Date   WBC 7.1 10/17/2022   HGB 12.1 (L) 10/17/2022   HCT 35.4 (L) 10/17/2022   MCV 87.0 10/17/2022   PLT 250 10/17/2022   Recent Labs    02/06/22 1009 06/19/22 0757 10/17/22 0756  NA 134* 136 136  K 3.8 3.8 3.4*  CL 96* 101 100  CO2 '28 29 27  '$ GLUCOSE 128* 252* 197*  BUN 28* 26* 22  CREATININE 1.28* 1.15 1.25*  CALCIUM 9.2 9.6 9.2  GFRNONAA 57* >60 58*  PROT 7.4 7.7 7.0  ALBUMIN 3.8 4.2 3.8  AST '22 20 22  '$ ALT '22 20 26  '$ ALKPHOS 66 63 58  BILITOT 0.9 1.1 0.6    RADIOGRAPHIC STUDIES: I have personally reviewed the radiological images as listed and agreed with the findings in the report. No results found.  Prostate cancer [facility]) #Recurrent-castrate sensitive prostate cancer/stage IV- PET JAN 11th,  2023- JAN- Solitary enlarged radiotracer avid RIGHT external iliac lymph node consistent with prostate cancer nodal metastasis;  No evidence of local recurrence within the prostate gland/or metastatic disease. PSA December 2022-3.96.  # Status post Eligard in [facility] Feb 2023 [45 mg q6M]-PSA MAY 2023-0.01 I reviewed the labs with the patient; and the fact that he had significant response in his PSA.  Discussed regarding intermittent ADT to make sure his quality of life is intact during these treatments.  He understands that treatments are indefinite; and not curative.  JAN 2024 2023 PSA less  than 0.01.  Okay to hold off Eligard today.  We will reassess again in 4 months.   # increased frequency of urination: recommend flomax.  Discussed regarding orthostatic hypotension and maneuvers to prevent it.  Discussed re: follow up with urology if not improved.  Patient currently does not have a urologist.  # Elevated BP [at home 130/75]- monitor for now.   # Hot flashes-G-1-2. Monitor for now . Stable.    # ?  CKD II/III -Creatnine- GFR  58-60- Stable.    # Fatigue: discussed re: CARE program; declined.    # DISPOSITION: # HOLD Eligard today # follow up in 4 months; MD- 1 week prior-labs-cbc/cmp;;PSA; possible Eligard--Dr.B   All questions were answered. The patient knows to call the clinic with any problems, questions or concerns.   Cammie Sickle, MD 10/24/2022 10:51 AM

## 2022-10-24 NOTE — Assessment & Plan Note (Addendum)
#  Recurrent-castrate sensitive prostate cancer/stage IV- PET JAN 11th,  2023- JAN- Solitary enlarged radiotracer avid RIGHT external iliac lymph node consistent with prostate cancer nodal metastasis;  No evidence of local recurrence within the prostate gland/or metastatic disease. PSA December 2022-3.96.  # Status post Eligard in [facility] Feb 2023 [45 mg q6M]-PSA MAY 2023-0.01 I reviewed the labs with the patient; and the fact that he had significant response in his PSA.  Discussed regarding intermittent ADT to make sure his quality of life is intact during these treatments.  He understands that treatments are indefinite; and not curative.  JAN 2024 2023 PSA less than 0.01.  Okay to hold off Eligard today.  We will reassess again in 4 months.   # increased frequency of urination: recommend flomax.  Discussed regarding orthostatic hypotension and maneuvers to prevent it.  Discussed re: follow up with urology if not improved.  Patient currently does not have a urologist.  # Elevated BP [at home 130/75]- monitor for now.   # Hot flashes-G-1-2. Monitor for now . Stable.    # ?  CKD II/III -Creatnine- GFR  58-60- Stable.    # Fatigue: discussed re: CARE program; declined.    # DISPOSITION: # HOLD Eligard today # follow up in 4 months; MD- 1 week prior-labs-cbc/cmp;;PSA; possible Eligard--Dr.B

## 2023-02-15 ENCOUNTER — Inpatient Hospital Stay: Payer: Medicare HMO | Attending: Internal Medicine

## 2023-02-15 DIAGNOSIS — M549 Dorsalgia, unspecified: Secondary | ICD-10-CM | POA: Insufficient documentation

## 2023-02-15 DIAGNOSIS — Z87891 Personal history of nicotine dependence: Secondary | ICD-10-CM | POA: Insufficient documentation

## 2023-02-15 DIAGNOSIS — M2550 Pain in unspecified joint: Secondary | ICD-10-CM | POA: Insufficient documentation

## 2023-02-15 DIAGNOSIS — Z79899 Other long term (current) drug therapy: Secondary | ICD-10-CM | POA: Insufficient documentation

## 2023-02-15 DIAGNOSIS — C61 Malignant neoplasm of prostate: Secondary | ICD-10-CM | POA: Insufficient documentation

## 2023-02-15 DIAGNOSIS — R5383 Other fatigue: Secondary | ICD-10-CM | POA: Diagnosis not present

## 2023-02-15 DIAGNOSIS — R232 Flushing: Secondary | ICD-10-CM | POA: Insufficient documentation

## 2023-02-15 DIAGNOSIS — Z833 Family history of diabetes mellitus: Secondary | ICD-10-CM | POA: Diagnosis not present

## 2023-02-15 DIAGNOSIS — R030 Elevated blood-pressure reading, without diagnosis of hypertension: Secondary | ICD-10-CM | POA: Insufficient documentation

## 2023-02-15 DIAGNOSIS — R350 Frequency of micturition: Secondary | ICD-10-CM | POA: Insufficient documentation

## 2023-02-15 LAB — COMPREHENSIVE METABOLIC PANEL
ALT: 20 U/L (ref 0–44)
AST: 18 U/L (ref 15–41)
Albumin: 3.9 g/dL (ref 3.5–5.0)
Alkaline Phosphatase: 67 U/L (ref 38–126)
Anion gap: 12 (ref 5–15)
BUN: 23 mg/dL (ref 8–23)
CO2: 27 mmol/L (ref 22–32)
Calcium: 9.2 mg/dL (ref 8.9–10.3)
Chloride: 98 mmol/L (ref 98–111)
Creatinine, Ser: 1.22 mg/dL (ref 0.61–1.24)
GFR, Estimated: 60 mL/min — ABNORMAL LOW (ref >60)
Glucose, Bld: 251 mg/dL — ABNORMAL HIGH (ref 70–99)
Potassium: 3.8 mmol/L (ref 3.5–5.1)
Sodium: 137 mmol/L (ref 135–145)
Total Bilirubin: 0.8 mg/dL (ref 0.3–1.2)
Total Protein: 7 g/dL (ref 6.5–8.1)

## 2023-02-15 LAB — CBC WITH DIFFERENTIAL/PLATELET
Abs Immature Granulocytes: 0.06 10*3/uL (ref 0.00–0.07)
Basophils Absolute: 0 10*3/uL (ref 0.0–0.1)
Basophils Relative: 1 %
Eosinophils Absolute: 0.5 10*3/uL (ref 0.0–0.5)
Eosinophils Relative: 8 %
HCT: 34.6 % — ABNORMAL LOW (ref 39.0–52.0)
Hemoglobin: 11.7 g/dL — ABNORMAL LOW (ref 13.0–17.0)
Immature Granulocytes: 1 %
Lymphocytes Relative: 20 %
Lymphs Abs: 1.3 10*3/uL (ref 0.7–4.0)
MCH: 29.4 pg (ref 26.0–34.0)
MCHC: 33.8 g/dL (ref 30.0–36.0)
MCV: 86.9 fL (ref 80.0–100.0)
Monocytes Absolute: 0.5 10*3/uL (ref 0.1–1.0)
Monocytes Relative: 7 %
Neutro Abs: 4.1 10*3/uL (ref 1.7–7.7)
Neutrophils Relative %: 63 %
Platelets: 213 10*3/uL (ref 150–400)
RBC: 3.98 MIL/uL — ABNORMAL LOW (ref 4.22–5.81)
RDW: 12.6 % (ref 11.5–15.5)
WBC: 6.4 10*3/uL (ref 4.0–10.5)
nRBC: 0 % (ref 0.0–0.2)

## 2023-02-15 LAB — PSA: Prostatic Specific Antigen: <0.01 ng/mL (ref 0.00–4.00)

## 2023-02-22 ENCOUNTER — Encounter: Payer: Self-pay | Admitting: Internal Medicine

## 2023-02-22 ENCOUNTER — Inpatient Hospital Stay (HOSPITAL_BASED_OUTPATIENT_CLINIC_OR_DEPARTMENT_OTHER): Payer: Medicare HMO | Admitting: Internal Medicine

## 2023-02-22 ENCOUNTER — Inpatient Hospital Stay: Payer: Medicare HMO

## 2023-02-22 VITALS — BP 122/68 | HR 72 | Temp 98.0°F | Ht 70.0 in | Wt 198.4 lb

## 2023-02-22 DIAGNOSIS — C61 Malignant neoplasm of prostate: Secondary | ICD-10-CM

## 2023-02-22 NOTE — Patient Instructions (Signed)
#  Recommend gentle iron [iron biglycinate; 28 mg ] 1 pill a day.  This pill is unlikely to cause stomach upset or cause constipation.  

## 2023-02-22 NOTE — Assessment & Plan Note (Addendum)
#  Recurrent-castrate sensitive prostate cancer/stage IV- PET JAN 11th,  2023- JAN- Solitary enlarged radiotracer avid RIGHT external iliac lymph node consistent with prostate cancer nodal metastasis;  No evidence of local recurrence within the prostate gland/or metastatic disease. PSA December 2022-3.96.  # Status post Eligard in [facility] Feb 2023 [45 mg q6M]-  #  I reviewed the labs with the patient; and the fact that he had significant response in his PSA.  Discussed regarding intermittent ADT to make sure his quality of life is intact during these treatments.  He understands that treatments are indefinite; and not curative.  MAY 2024-PSA less than 0.01.  Okay to hold off Eligard today.  We will reassess again in 4 months.   # Mild anemia- April 2024 [melena] s/p EGD- [[facility] [place] [facility]]- awaiting repeat EGD next month. EGD showed gastric ulcers. #Recommend gentle iron [iron biglycinate; 28 mg ] 1 pill a day.  This pill is unlikely to cause stomach upset or cause constipation.   # increased frequency of urination [no urologist]: on  flomax- stable.   # DM- FBG- 251- recommend monitoring closely at home.   # Elevated BP [at home 130/75]- monitor for now.   # Hot flashes-G-1-2. Monitor for now . Stable.    # ?  CKD II/III -Creatnine- GFR  58-60- Stable.    # Fatigue: discussed re: CARE program; declined.    # DISPOSITION: # HOLD Eligard today # follow up in 4 months; MD- 1 week prior-labs-cbc/cmp;iron studies; ferritin;PSA; possible Eligard--Dr.B

## 2023-02-22 NOTE — Progress Notes (Signed)
C/o fatigue and hot flashes.

## 2023-02-22 NOTE — Progress Notes (Signed)
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  His pretreatment PSA was 9.  S/p  IMRT to his prostate.  He has never undergone radical prostatectomy.His PSA subsequently went down to 0.2 in 2015.  More recently when patient had his blood work done his PSA was elevated at 1.6 and therefore patient has been referred to [place].  It appears that patient has never received any antiandrogen therapy in the past.    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      Prostate cancer ([facility])  04/24/2021 Initial Diagnosis   Prostate cancer ([facility])   04/24/2021 Cancer Staging   Staging form: Prostate, AJCC 8th Edition - Clinical stage from 04/24/2021: Stage IIB (cT1c, cN0, cM0, PSA: 9, Grade Group: 2) - Signed by Creig Hines, MD on 04/24/2021 Prostate specific antigen (PSA) range: Less than 10 Gleason score: 7 Histologic grading system: 5 grade system   10/18/2021 Cancer Staging   Staging form: Prostate, AJCC 8th Edition - Pathologic: Stage IVB (pM1a) - Signed by Earna Coder, MD on 10/18/2021     HISTORY OF PRESENTING ILLNESS: Alone.  Ambulating independently.  ADDEN Devon Anderson 82 y.o.  male above history of castrate sensitive metastatic prostate cancer on eligard intermittent because of side effects is here for follow-up/review results of his PSA.   Interim had EGD/colo in [facility] [place]- had gastric ulcers.   Patient last Eligard was in February 2023.  Patient continues to have intermittent hot flashes.  Chronic mild fatigue.  Not any worse.   Urinary frequency - improved with  Flomax.  Denies any nausea vomiting abdominal pain.  No fever no chills.   Review of Systems  Constitutional:  Positive for malaise/fatigue. Negative for chills, diaphoresis, fever and weight loss.  HENT:  Negative for nosebleeds and sore throat.   Eyes:  Negative for double vision.  Respiratory:  Negative for cough, hemoptysis and sputum production.   Cardiovascular:  Negative for chest pain, palpitations, orthopnea and leg swelling.  Gastrointestinal:  Negative for abdominal pain, blood in stool, constipation, diarrhea, heartburn, melena, nausea and vomiting.  Genitourinary:  Positive for frequency. Negative for dysuria and urgency.  Musculoskeletal:  Positive for back pain and joint pain.  Skin: Negative.  Negative for itching and rash.  Neurological:  Negative for dizziness, tingling, focal weakness, weakness and headaches.  Endo/Heme/Allergies:  Does not bruise/bleed easily.  Psychiatric/Behavioral:  Negative for depression. The patient is not nervous/anxious and does not have insomnia.      MEDICAL HISTORY:  Past Medical History:  Diagnosis Date   Diabetes mellitus, type 2 ([facility])    Erectile dysfunction    Exogenous obesity    Hx of gout    Hyperlipidemia    Hypertension    Hypokalemia    Prostate cancer ([facility])     SURGICAL HISTORY: History reviewed. No pertinent surgical history.  SOCIAL HISTORY: Social History   Socioeconomic History   Marital status: Unknown    Spouse name: Not on file   Number of children: Not on file   Years of education: Not on file   Highest  education level: Not on file  Occupational History   Not on file  Tobacco Use   Smoking status: Former    Types: Cigarettes   Smokeless tobacco: Never  Vaping Use   Vaping Use: Never used  Substance and Sexual Activity   Alcohol use: Not on file   Drug use: Not on file   Sexual activity: Not on file  Other Topics Concern   Not on file  Social History Narrative   Not on file   Social  Determinants of Health   Financial Resource Strain: Not on file  Food Insecurity: Not on file  Transportation Needs: Not on file  Physical Activity: Not on file  Stress: Not on file  Social Connections: Not on file  Intimate Partner Violence: Not on file    FAMILY HISTORY: Family History  Problem Relation Age of Onset   Diabetes Father     ALLERGIES:  has No Known Allergies.  MEDICATIONS:  Current Outpatient Medications  Medication Sig Dispense Refill   amLODipine (NORVASC) 10 MG tablet Take 10 mg by mouth daily.     aspirin 81 MG EC tablet Take by mouth.     atenolol (TENORMIN) 100 MG tablet Take 100 mg by mouth daily.     atorvastatin (LIPITOR) 80 MG tablet Take 80 mg by mouth at bedtime.     docusate sodium (COLACE) 100 MG capsule Take 100 mg by mouth 2 (two) times daily.     glipiZIDE (GLUCOTROL) 5 MG tablet Take by mouth.     lisinopril-hydrochlorothiazide (ZESTORETIC) 20-12.5 MG tablet Take 1 tablet by mouth 2 (two) times daily.     Melatonin 10 MG TABS Take 10 mg by mouth at bedtime as needed.     metFORMIN (GLUCOPHAGE) 1000 MG tablet Take 1,000 mg by mouth 2 (two) times daily.     niacin (SLO-NIACIN) 500 MG tablet Take by mouth.     omeprazole (PRILOSEC) 40 MG capsule Take 40 mg by mouth 2 (two) times daily.     potassium chloride (KLOR-CON) 10 MEQ tablet Take 10 mEq by mouth daily.     tamsulosin (FLOMAX) 0.4 MG CAPS capsule Take 1 capsule (0.4 mg total) by mouth daily after supper. 90 capsule 1   No current facility-administered medications for this visit.      Marland Kitchen  PHYSICAL EXAMINATION:  Vitals:   02/22/23 0934  BP: 122/68  Pulse: 72  Temp: 98 F (36.7 C)  SpO2: 96%   Filed Weights   02/22/23 0934  Weight: 198 lb 6.4 oz (90 kg)    Physical Exam Vitals and nursing note reviewed.  HENT:     Head: Normocephalic and atraumatic.     Mouth/Throat:     Pharynx: Oropharynx is clear.  Eyes:     Extraocular Movements: Extraocular movements intact.      Pupils: Pupils are equal, round, and reactive to light.  Cardiovascular:     Rate and Rhythm: Normal rate and regular rhythm.  Pulmonary:     Comments: Decreased breath sounds bilaterally.  Abdominal:     Palpations: Abdomen is soft.  Musculoskeletal:        General: Normal range of motion.     Cervical back: Normal range of motion.  Skin:    General: Skin is warm.  Neurological:     General: No focal deficit present.     Mental Status: He is alert and oriented to person, place, and time.  Psychiatric:  Behavior: Behavior normal.        Judgment: Judgment normal.      LABORATORY DATA:  I have reviewed the data as listed Lab Results  Component Value Date   WBC 6.4 02/15/2023   HGB 11.7 (L) 02/15/2023   HCT 34.6 (L) 02/15/2023   MCV 86.9 02/15/2023   PLT 213 02/15/2023   Recent Labs    06/19/22 0757 10/17/22 0756 02/15/23 0754  NA 136 136 137  K 3.8 3.4* 3.8  CL 101 100 98  CO2 29 27 27   GLUCOSE 252* 197* 251*  BUN 26* 22 23  CREATININE 1.15 1.25* 1.22  CALCIUM 9.6 9.2 9.2  GFRNONAA >60 58* 60*  PROT 7.7 7.0 7.0  ALBUMIN 4.2 3.8 3.9  AST 20 22 18   ALT 20 26 20   ALKPHOS 63 58 67  BILITOT 1.1 0.6 0.8    RADIOGRAPHIC STUDIES: I have personally reviewed the radiological images as listed and agreed with the findings in the report. No results found.  Prostate cancer [facility]) #Recurrent-castrate sensitive prostate cancer/stage IV- PET JAN 11th,  2023- JAN- Solitary enlarged radiotracer avid RIGHT external iliac lymph node consistent with prostate cancer nodal metastasis;  No evidence of local recurrence within the prostate gland/or metastatic disease. PSA December 2022-3.96.  # Status post Eligard in [facility] Feb 2023 [45 mg q6M]-  #  I reviewed the labs with the patient; and the fact that he had significant response in his PSA.  Discussed regarding intermittent ADT to make sure his quality of life is intact during these treatments.  He understands that treatments  are indefinite; and not curative.  MAY 2024-PSA less than 0.01.  Okay to hold off Eligard today.  We will reassess again in 4 months.   # Mild anemia- April 2024 [melena] s/p EGD- [[facility] [place] [facility] awaiting repeat EGD next month. EGD showed gastric ulcers. #Recommend gentle iron [iron biglycinate; 28 mg ] 1 pill a day.  This pill is unlikely to cause stomach upset or cause constipation.    # increased frequency of urination [no urologist]: on  flomax- stable.   # DM- FBG- 251- recommend monitoring closely at home.   # Elevated BP [at home 130/75]- monitor for now.   # Hot flashes-G-1-2. Monitor for now . Stable.    # ?  CKD II/III -Creatnine- GFR  58-60- Stable.    # Fatigue: discussed re: CARE program; declined.    # DISPOSITION: # HOLD Eligard today # follow up in 4 months; MD- 1 week prior-labs-cbc/cmp;iron studies; ferritin;PSA; possible Eligard--Dr.B   All questions were answered. The patient knows to call the clinic with any problems, questions or concerns.   Earna Coder, MD 02/22/2023 10:08 AM

## 2023-06-18 ENCOUNTER — Inpatient Hospital Stay: Payer: Medicare HMO | Attending: Internal Medicine

## 2023-06-18 DIAGNOSIS — K259 Gastric ulcer, unspecified as acute or chronic, without hemorrhage or perforation: Secondary | ICD-10-CM | POA: Insufficient documentation

## 2023-06-18 DIAGNOSIS — C61 Malignant neoplasm of prostate: Secondary | ICD-10-CM

## 2023-06-18 DIAGNOSIS — Z87891 Personal history of nicotine dependence: Secondary | ICD-10-CM | POA: Insufficient documentation

## 2023-06-18 DIAGNOSIS — M2550 Pain in unspecified joint: Secondary | ICD-10-CM | POA: Insufficient documentation

## 2023-06-18 DIAGNOSIS — R030 Elevated blood-pressure reading, without diagnosis of hypertension: Secondary | ICD-10-CM | POA: Insufficient documentation

## 2023-06-18 DIAGNOSIS — Z833 Family history of diabetes mellitus: Secondary | ICD-10-CM | POA: Diagnosis not present

## 2023-06-18 DIAGNOSIS — D649 Anemia, unspecified: Secondary | ICD-10-CM | POA: Insufficient documentation

## 2023-06-18 DIAGNOSIS — R232 Flushing: Secondary | ICD-10-CM | POA: Insufficient documentation

## 2023-06-18 DIAGNOSIS — G4700 Insomnia, unspecified: Secondary | ICD-10-CM | POA: Insufficient documentation

## 2023-06-18 DIAGNOSIS — Z79899 Other long term (current) drug therapy: Secondary | ICD-10-CM | POA: Insufficient documentation

## 2023-06-18 DIAGNOSIS — R5383 Other fatigue: Secondary | ICD-10-CM | POA: Diagnosis not present

## 2023-06-18 DIAGNOSIS — M549 Dorsalgia, unspecified: Secondary | ICD-10-CM | POA: Insufficient documentation

## 2023-06-18 DIAGNOSIS — R350 Frequency of micturition: Secondary | ICD-10-CM | POA: Insufficient documentation

## 2023-06-18 LAB — CMP (CANCER CENTER ONLY)
ALT: 13 U/L (ref 0–44)
AST: 11 U/L — ABNORMAL LOW (ref 15–41)
Albumin: 3.5 g/dL (ref 3.5–5.0)
Alkaline Phosphatase: 54 U/L (ref 38–126)
Anion gap: 5 (ref 5–15)
BUN: 19 mg/dL (ref 8–23)
CO2: 26 mmol/L (ref 22–32)
Calcium: 8.7 mg/dL — ABNORMAL LOW (ref 8.9–10.3)
Chloride: 103 mmol/L (ref 98–111)
Creatinine: 1.1 mg/dL (ref 0.61–1.24)
GFR, Estimated: >60 mL/min (ref >60)
Glucose, Bld: 119 mg/dL — ABNORMAL HIGH (ref 70–99)
Potassium: 3.8 mmol/L (ref 3.5–5.1)
Sodium: 134 mmol/L — ABNORMAL LOW (ref 135–145)
Total Bilirubin: 1 mg/dL (ref 0.3–1.2)
Total Protein: 6.7 g/dL (ref 6.5–8.1)

## 2023-06-18 LAB — FERRITIN: Ferritin: 95 ng/mL (ref 24–336)

## 2023-06-18 LAB — CBC WITH DIFFERENTIAL (CANCER CENTER ONLY)
Abs Immature Granulocytes: 0.09 10*3/uL — ABNORMAL HIGH (ref 0.00–0.07)
Basophils Absolute: 0 10*3/uL (ref 0.0–0.1)
Basophils Relative: 0 %
Eosinophils Absolute: 0.2 10*3/uL (ref 0.0–0.5)
Eosinophils Relative: 3 %
HCT: 33 % — ABNORMAL LOW (ref 39.0–52.0)
Hemoglobin: 10.6 g/dL — ABNORMAL LOW (ref 13.0–17.0)
Immature Granulocytes: 1 %
Lymphocytes Relative: 16 %
Lymphs Abs: 1.2 10*3/uL (ref 0.7–4.0)
MCH: 28.9 pg (ref 26.0–34.0)
MCHC: 32.1 g/dL (ref 30.0–36.0)
MCV: 89.9 fL (ref 80.0–100.0)
Monocytes Absolute: 0.6 10*3/uL (ref 0.1–1.0)
Monocytes Relative: 9 %
Neutro Abs: 5.1 10*3/uL (ref 1.7–7.7)
Neutrophils Relative %: 71 %
Platelet Count: 216 10*3/uL (ref 150–400)
RBC: 3.67 MIL/uL — ABNORMAL LOW (ref 4.22–5.81)
RDW: 14.5 % (ref 11.5–15.5)
WBC Count: 7.2 10*3/uL (ref 4.0–10.5)
nRBC: 0 % (ref 0.0–0.2)

## 2023-06-18 LAB — IRON AND TIBC
Iron: 48 ug/dL (ref 45–182)
Saturation Ratios: 16 % — ABNORMAL LOW (ref 17.9–39.5)
TIBC: 301 ug/dL (ref 250–450)
UIBC: 253 ug/dL

## 2023-06-18 LAB — PSA: Prostatic Specific Antigen: 0.01 ng/mL (ref 0.00–4.00)

## 2023-06-25 ENCOUNTER — Ambulatory Visit: Payer: Medicare HMO | Admitting: Internal Medicine

## 2023-06-25 ENCOUNTER — Encounter: Payer: Self-pay | Admitting: Internal Medicine

## 2023-06-25 ENCOUNTER — Inpatient Hospital Stay: Payer: Medicare HMO

## 2023-06-25 ENCOUNTER — Inpatient Hospital Stay (HOSPITAL_BASED_OUTPATIENT_CLINIC_OR_DEPARTMENT_OTHER): Payer: Medicare HMO | Admitting: Internal Medicine

## 2023-06-25 ENCOUNTER — Ambulatory Visit: Payer: Medicare HMO

## 2023-06-25 VITALS — BP 142/87 | HR 73 | Temp 98.6°F | Resp 19 | Wt 198.2 lb

## 2023-06-25 DIAGNOSIS — C61 Malignant neoplasm of prostate: Secondary | ICD-10-CM

## 2023-06-25 DIAGNOSIS — D649 Anemia, unspecified: Secondary | ICD-10-CM | POA: Diagnosis not present

## 2023-06-25 DIAGNOSIS — G4700 Insomnia, unspecified: Secondary | ICD-10-CM

## 2023-06-25 MED ORDER — ZOLPIDEM TARTRATE 5 MG PO TABS: 5.0000 mg | ORAL_TABLET | Freq: Every evening | ORAL | 0 refills | Status: AC | PRN

## 2023-06-25 NOTE — Progress Notes (Signed)
Patient states he has no energy and is very short of breath with short distances. Patient states this has ben going on a while.

## 2023-06-25 NOTE — Progress Notes (Signed)
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  His pretreatment PSA was 9.  S/p  IMRT to his prostate.  He has never undergone radical prostatectomy.His PSA subsequently went down to 0.2 in 2015.  More recently when patient had his blood work done his PSA was elevated at 1.6 and therefore patient has been referred to [place].  It appears that patient has never received any antiandrogen therapy in the past.    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      Prostate cancer (HCC)  04/24/2021 Initial Diagnosis   Prostate cancer (HCC)   04/24/2021 Cancer Staging   Staging form: Prostate, AJCC 8th Edition - Clinical stage from 04/24/2021: Stage IIB (cT1c, cN0, cM0, PSA: 9, Grade Group: 2) - Signed by Creig Hines, MD on 04/24/2021 Prostate specific antigen (PSA) range: Less than 10 Gleason score: 7 Histologic grading system: 5 grade system   10/18/2021 Cancer Staging   Staging form: Prostate, AJCC 8th Edition - Pathologic: Stage IVB (pM1a) - Signed by Earna Coder, MD on 10/18/2021     HISTORY OF PRESENTING ILLNESS: Alone.  Ambulating independently.  Devon Anderson 82 y.o.  male above history of castrate sensitive metastatic prostate cancer on eligard intermittent because of side effects is here for follow-up/review results of his PSA.   Patient states he has no energy and is very short of breath with short distances. Patient states this has ben going on a while   Interim had EGD/colo in [place]- had  gastric ulcers.   Patient last Eligard was in February 2023.  Patient continues to have intermittent hot flashes.  Chronic mild fatigue.  Not any worse.   Urinary frequency - improved with Flomax.  Denies any nausea vomiting abdominal pain.  No fever no chills.   Review of Systems  Constitutional:  Positive for malaise/fatigue. Negative for chills, diaphoresis, fever and weight loss.  HENT:  Negative for nosebleeds and sore throat.   Eyes:  Negative for double vision.  Respiratory:  Negative for cough, hemoptysis and sputum production.   Cardiovascular:  Negative for chest pain, palpitations, orthopnea and leg swelling.  Gastrointestinal:  Negative for abdominal pain, blood in stool, constipation, diarrhea, heartburn, melena, nausea and vomiting.  Genitourinary:  Positive for frequency. Negative for dysuria and urgency.  Musculoskeletal:  Positive for back pain and joint pain.  Skin: Negative.  Negative for itching and rash.  Neurological:  Negative for dizziness, tingling, focal weakness, weakness and headaches.  Endo/Heme/Allergies:  Does not bruise/bleed easily.  Psychiatric/Behavioral:  Negative for depression. The patient is not nervous/anxious and does not have insomnia.      MEDICAL HISTORY:  Past Medical History:  Diagnosis Date   Diabetes mellitus, type 2 (HCC)    Erectile dysfunction    Exogenous obesity    Hx of gout    Hyperlipidemia    Hypertension    Hypokalemia    Prostate cancer (HCC)     SURGICAL HISTORY: History reviewed. No pertinent surgical history.  SOCIAL HISTORY: Social History  Socioeconomic History   Marital status: Unknown    Spouse name: Not on file   Number of children: Not on file   Years of education: Not on file   Highest education level: Not on file  Occupational History   Not on file  Tobacco Use   Smoking status: Former    Types: Cigarettes   Smokeless tobacco: Never  Vaping Use   Vaping status: Never Used  Substance and Sexual  Activity   Alcohol use: Not on file   Drug use: Not on file   Sexual activity: Not on file  Other Topics Concern   Not on file  Social History Narrative   Not on file   Social Determinants of Health   Financial Resource Strain: Low Risk  (07/31/2022)   Received from [facility], [facility]   Overall Financial Resource Strain (CARDIA)    Difficulty of Paying Living Expenses: Not hard at all  Food Insecurity: No Food Insecurity (07/31/2022)   Received [facility], [facility]   Hunger Vital Sign    Worried About Running Out of Food in the Last Year: Never true    Ran Out of Food in the Last Year: Never true  Transportation Needs: No Transportation Needs (07/31/2022)   Received from [facility], [facility]   [facility] - Transportation    Lack of Transportation (Medical): No    Lack of Transportation (Non-Medical): No  Physical Activity: Not on file  Stress: Not on file  Social Connections: Not on file  Intimate Partner Violence: Not on file    FAMILY HISTORY: Family History  Problem Relation Age of Onset   Diabetes Father     ALLERGIES:  has No Known Allergies.  MEDICATIONS:  Current Outpatient Medications  Medication Sig Dispense Refill   amLODipine (NORVASC) 10 MG tablet Take 10 mg by mouth daily.     aspirin 81 MG EC tablet Take by mouth.     atenolol (TENORMIN) 100 MG tablet Take 100 mg by mouth daily.     atorvastatin (LIPITOR) 80 MG tablet Take 80 mg by mouth at bedtime.     cyanocobalamin (VITAMIN B12) 1000 MCG tablet Take 1,000 mcg by mouth daily.     docusate sodium (COLACE) 100 MG capsule Take 100 mg by mouth 2 (two) times daily.     glipiZIDE (GLUCOTROL) 5 MG tablet Take by mouth.     lisinopril-hydrochlorothiazide (ZESTORETIC) 20-12.5 MG tablet Take 1 tablet by mouth 2 (two) times daily.     Melatonin 10 MG TABS Take 10 mg by mouth at bedtime as needed.     metFORMIN (GLUCOPHAGE) 1000 MG tablet Take 1,000 mg by mouth 2 (two)  times daily.     niacin (SLO-NIACIN) 500 MG tablet Take by mouth.     omeprazole (PRILOSEC) 40 MG capsule Take 40 mg by mouth 2 (two) times daily.     potassium chloride (KLOR-CON) 10 MEQ tablet Take 10 mEq by mouth daily.     tamsulosin (FLOMAX) 0.4 MG CAPS capsule Take 1 capsule (0.4 mg total) by mouth daily after supper. 90 capsule 1   zolpidem (AMBIEN) 5 MG tablet Take 1 tablet (5 mg total) by mouth at bedtime as needed for sleep. 30 tablet 0   No current facility-administered medications for this visit.      Marland Kitchen  PHYSICAL EXAMINATION:  Vitals:   06/25/23 0930  BP: (!) 142/87  Pulse: 73  Resp: 19  Temp:  98.6 F (37 C)  SpO2: 99%   Filed Weights   06/25/23 0930  Weight: 198 lb 3.2 oz (89.9 kg)    Physical Exam Vitals and nursing note reviewed.  HENT:     Head: Normocephalic and atraumatic.     Mouth/Throat:     Pharynx: Oropharynx is clear.  Eyes:     Extraocular Movements: Extraocular movements intact.     Pupils: Pupils are equal, round, and reactive to light.  Cardiovascular:     Rate and Rhythm: Normal rate and regular rhythm.  Pulmonary:     Comments: Decreased breath sounds bilaterally.  Abdominal:     Palpations: Abdomen is soft.  Musculoskeletal:        General: Normal range of motion.     Cervical back: Normal range of motion.  Skin:    General: Skin is warm.  Neurological:     General: No focal deficit present.     Mental Status: He is alert and oriented to person, place, and time.  Psychiatric:        Behavior: Behavior normal.        Judgment: Judgment normal.      LABORATORY DATA:  I have reviewed the data as listed Lab Results  Component Value Date   WBC 7.2 06/18/2023   HGB 10.6 (L) 06/18/2023   HCT 33.0 (L) 06/18/2023   MCV 89.9 06/18/2023   PLT 216 06/18/2023   Recent Labs    10/17/22 0756 02/15/23 0754 06/18/23 0759  NA 136 137 134*  K 3.4* 3.8 3.8  CL 100 98 103  CO2 27 27 26   GLUCOSE 197* 251* 119*  BUN 22 23 19    CREATININE 1.25* 1.22 1.10  CALCIUM 9.2 9.2 8.7*  GFRNONAA 58* 60* >60  PROT 7.0 7.0 6.7  ALBUMIN 3.8 3.9 3.5  AST 22 18 11*  ALT 26 20 13   ALKPHOS 58 67 54  BILITOT 0.6 0.8 1.0    RADIOGRAPHIC STUDIES: I have personally reviewed the radiological images as listed and agreed with the findings in the report. No results found.  Prostate cancer [facility]) #Recurrent-castrate sensitive prostate cancer/stage IV- PET JAN 11th,  2023- JAN- Solitary enlarged radiotracer avid RIGHT external iliac lymph node consistent with prostate cancer nodal metastasis;  No evidence of local recurrence within the prostate gland/or metastatic disease. PSA December 2022-3.96.  # Status post Eligard in [facility] Feb 2023 [45 mg q6M]-  #  I reviewed the labs with the patient; and the fact that he had significant response in his PSA.  Discussed regarding intermittent ADT to make sure his quality of life is intact during these treatments.  He understands that treatments are indefinite; and not curative. SEP 2024-PSA= 0.01.  Okay to hold off Eligard today.  We will reassess again in 4 months.   # worsening fatigue: ? Low testosterone;AUg 2024- CXR/EKG [[facility] reviewed] referral to [facility] cardiology re dyspnea.   # Mild anemia- April 2024 [melena] s/p EGD-EGD showed gastric ulcers- [[facility], [place] [facility]]-EGD x2 [summer 2024]  currently gentle iron [iron biglycinate; 28 mg ] 1 pill a day-  Hb 10; Discussed the potential acute infusion reactions with IV iron; which are quite rare.  Patient understands the risk; will proceed with infusions.   # increased frequency of urination [no urologist]: on  flomax- stable.   # DM- FBG- 119- recommend monitoring closely at home- stable.   # Elevated BP [at home 130/75]- monitor for now.   # Hot flashes-G-1-2. Monitor for  now .  Stable.    # ?  CKD II/III -Creatnine- GFR  58-60- Stable.    # Fatigue: discussed re: CARE program; will consider at next visit.     # Insomnia- ambien prn at  bedtime [will refill]; future refills- need to follow up with PCP; ; discussed the risk of falls.   # DISPOSITION: # HOLD Eligard today # venofer in 1 week # venofer in 2 weeks. # referral to [facility] cardiology re dyspnea.  # follow up in 4 months; MD- 1 week prior-labs-cbc/cmp;iron studies; ferritin;PSA; possible Eligard; possible Venofer---Dr.B   All questions were answered. The patient knows to call the clinic with any problems, questions or concerns.   Earna Coder, MD 06/25/2023 10:35 AM

## 2023-06-25 NOTE — Assessment & Plan Note (Addendum)
#  Recurrent-castrate sensitive prostate cancer/stage IV- PET JAN 11th,  2023- JAN- Solitary enlarged radiotracer avid RIGHT external iliac lymph node consistent with prostate cancer nodal metastasis;  No evidence of local recurrence within the prostate gland/or metastatic disease. PSA December 2022-3.96.  # Status post Eligard in [facility] Feb 2023 [45 mg q6M]-  #  I reviewed the labs with the patient; and the fact that he had significant response in his PSA.  Discussed regarding intermittent ADT to make sure his quality of life is intact during these treatments.  He understands that treatments are indefinite; and not curative. SEP 2024-PSA= 0.01.  Okay to hold off Eligard today.  We will reassess again in 4 months.   # worsening fatigue: ? Low testosterone;AUg 2024- CXR/EKG [[facility] reviewed] referral to [facility] cardiology re dyspnea.   # Mild anemia- April 2024 [melena] s/p EGD-EGD showed gastric ulcers- [[facility] [place] [facility]]-EGD x2 [summer 2024]  currently gentle iron [iron biglycinate; 28 mg ] 1 pill a day-  Hb 10; Discussed the potential acute infusion reactions with IV iron; which are quite rare.  Patient understands the risk; will proceed with infusions.   # increased frequency of urination [no urologist]: on  flomax- stable.   # DM- FBG- 119- recommend monitoring closely at home- stable.   # Elevated BP [at home 130/75]- monitor for now.   # Hot flashes-G-1-2. Monitor for now .  Stable.    # ?  CKD II/III -Creatnine- GFR  58-60- Stable.    # Fatigue: discussed re: CARE program; will consider at next visit.     # Insomnia- ambien prn at bedtime [will refill]; future refills- need to follow up with PCP; ; discussed the risk of falls.   # DISPOSITION: # HOLD Eligard today # venofer in 1 week # venofer in 2 weeks. # referral to [facility] cardiology re dyspnea.  # follow up in 4 months; MD- 1 week prior-labs-cbc/cmp;iron studies; ferritin;PSA; possible Eligard; possible Venofer---Dr.B

## 2023-07-01 ENCOUNTER — Inpatient Hospital Stay: Payer: Medicare HMO

## 2023-07-01 VITALS — BP 147/87 | HR 57 | Temp 97.8°F | Resp 19

## 2023-07-01 DIAGNOSIS — C61 Malignant neoplasm of prostate: Secondary | ICD-10-CM

## 2023-07-01 MED ORDER — SODIUM CHLORIDE 0.9 % IV SOLN
200.0000 mg | Freq: Once | INTRAVENOUS | Status: AC
  Administered 2023-07-01: 200 mg via INTRAVENOUS
  Filled 2023-07-01: qty 200

## 2023-07-01 MED ORDER — SODIUM CHLORIDE 0.9 % IV SOLN
Freq: Once | INTRAVENOUS | Status: AC
  Filled 2023-07-01: qty 250

## 2023-07-01 NOTE — Patient Instructions (Signed)
Iron Sucrose Injection What is this medication? IRON SUCROSE (EYE ern SOO krose) treats low levels of iron (iron deficiency anemia) in people with kidney disease. Iron is a mineral that plays an important role in making red blood cells, which carry oxygen from your lungs to the rest of your body. This medicine may be used for other purposes; ask your health care provider or pharmacist if you have questions. COMMON BRAND NAME(S): Venofer What should I tell my care team before I take this medication? They need to know if you have any of these conditions: Anemia not caused by low iron levels Heart disease High levels of iron in the blood Kidney disease Liver disease An unusual or allergic reaction to iron, other medications, foods, dyes, or preservatives Pregnant or trying to get pregnant Breastfeeding How should I use this medication? This medication is for infusion into a vein. It is given in a hospital or clinic setting. Talk to your care team about the use of this medication in children. While this medication may be prescribed for children as young as 2 years for selected conditions, precautions do apply. Overdosage: If you think you have taken too much of this medicine contact a poison control center or emergency room at once. NOTE: This medicine is only for you. Do not share this medicine with others. What if I miss a dose? Keep appointments for follow-up doses. It is important not to miss your dose. Call your care team if you are unable to keep an appointment. What may interact with this medication? Do not take this medication with any of the following: Deferoxamine Dimercaprol Other iron products This medication may also interact with the following: Chloramphenicol Deferasirox This list may not describe all possible interactions. Give your health care provider a list of all the medicines, herbs, non-prescription drugs, or dietary supplements you use. Also tell them if you smoke,  drink alcohol, or use illegal drugs. Some items may interact with your medicine. What should I watch for while using this medication? Visit your care team regularly. Tell your care team if your symptoms do not start to get better or if they get worse. You may need blood work done while you are taking this medication. You may need to follow a special diet. Talk to your care team. Foods that contain iron include: whole grains/cereals, dried fruits, beans, or peas, leafy green vegetables, and organ meats (liver, kidney). What side effects may I notice from receiving this medication? Side effects that you should report to your care team as soon as possible: Allergic reactions--skin rash, itching, hives, swelling of the face, lips, tongue, or throat Low blood pressure--dizziness, feeling faint or lightheaded, blurry vision Shortness of breath Side effects that usually do not require medical attention (report to your care team if they continue or are bothersome): Flushing Headache Joint pain Muscle pain Nausea Pain, redness, or irritation at injection site This list may not describe all possible side effects. Call your doctor for medical advice about side effects. You may report side effects to FDA at 1-800-FDA-1088. Where should I keep my medication? This medication is given in a hospital or clinic. It will not be stored at home. NOTE: This sheet is a summary. It may not cover all possible information. If you have questions about this medicine, talk to your doctor, pharmacist, or health care provider.  2024 Elsevier/Gold Standard (2023-02-22 00:00:00)

## 2023-07-08 ENCOUNTER — Inpatient Hospital Stay: Payer: Medicare HMO | Attending: Internal Medicine

## 2023-07-08 VITALS — BP 137/78 | HR 61 | Temp 98.3°F | Resp 18

## 2023-07-08 DIAGNOSIS — C61 Malignant neoplasm of prostate: Secondary | ICD-10-CM | POA: Insufficient documentation

## 2023-07-08 DIAGNOSIS — Z79899 Other long term (current) drug therapy: Secondary | ICD-10-CM | POA: Insufficient documentation

## 2023-07-08 DIAGNOSIS — K259 Gastric ulcer, unspecified as acute or chronic, without hemorrhage or perforation: Secondary | ICD-10-CM | POA: Diagnosis present

## 2023-07-08 MED ORDER — SODIUM CHLORIDE 0.9 % IV SOLN
200.0000 mg | Freq: Once | INTRAVENOUS | Status: AC
  Administered 2023-07-08: 200 mg via INTRAVENOUS
  Filled 2023-07-08: qty 200

## 2023-07-08 MED ORDER — SODIUM CHLORIDE 0.9 % IV SOLN
Freq: Once | INTRAVENOUS | Status: AC
  Filled 2023-07-08: qty 250

## 2023-07-08 NOTE — Progress Notes (Signed)
Pt has been educated and understands. Pt declined to stay 30 mins after iron infusion. VSS.

## 2023-07-22 ENCOUNTER — Telehealth: Payer: Self-pay | Admitting: *Deleted

## 2023-07-22 NOTE — Telephone Encounter (Signed)
Patient called reporting that he has had 2 iron infusion and was told if he was no better in 2 weeks to call and he is calling stating that he is no better(,fatigued )  Please advise

## 2023-08-01 ENCOUNTER — Other Ambulatory Visit: Payer: Self-pay | Admitting: *Deleted

## 2023-08-01 DIAGNOSIS — D649 Anemia, unspecified: Secondary | ICD-10-CM

## 2023-08-02 ENCOUNTER — Inpatient Hospital Stay: Payer: Medicare HMO

## 2023-08-02 ENCOUNTER — Inpatient Hospital Stay: Payer: Medicare HMO | Attending: Internal Medicine

## 2023-08-02 ENCOUNTER — Encounter: Payer: Self-pay | Admitting: Nurse Practitioner

## 2023-08-02 ENCOUNTER — Inpatient Hospital Stay: Payer: Medicare HMO | Admitting: Nurse Practitioner

## 2023-08-02 VITALS — BP 147/87 | HR 65 | Temp 96.7°F | Ht 70.0 in | Wt 200.0 lb

## 2023-08-02 DIAGNOSIS — N183 Chronic kidney disease, stage 3 unspecified: Secondary | ICD-10-CM | POA: Insufficient documentation

## 2023-08-02 DIAGNOSIS — G4700 Insomnia, unspecified: Secondary | ICD-10-CM | POA: Insufficient documentation

## 2023-08-02 DIAGNOSIS — D649 Anemia, unspecified: Secondary | ICD-10-CM

## 2023-08-02 DIAGNOSIS — C61 Malignant neoplasm of prostate: Secondary | ICD-10-CM

## 2023-08-02 DIAGNOSIS — K259 Gastric ulcer, unspecified as acute or chronic, without hemorrhage or perforation: Secondary | ICD-10-CM | POA: Insufficient documentation

## 2023-08-02 DIAGNOSIS — Z79899 Other long term (current) drug therapy: Secondary | ICD-10-CM | POA: Diagnosis not present

## 2023-08-02 DIAGNOSIS — I129 Hypertensive chronic kidney disease with stage 1 through stage 4 chronic kidney disease, or unspecified chronic kidney disease: Secondary | ICD-10-CM | POA: Diagnosis not present

## 2023-08-02 DIAGNOSIS — Z833 Family history of diabetes mellitus: Secondary | ICD-10-CM | POA: Diagnosis not present

## 2023-08-02 DIAGNOSIS — Z87891 Personal history of nicotine dependence: Secondary | ICD-10-CM | POA: Diagnosis not present

## 2023-08-02 DIAGNOSIS — D509 Iron deficiency anemia, unspecified: Secondary | ICD-10-CM | POA: Diagnosis not present

## 2023-08-02 DIAGNOSIS — R5383 Other fatigue: Secondary | ICD-10-CM | POA: Insufficient documentation

## 2023-08-02 LAB — CMP (CANCER CENTER ONLY)
ALT: 14 U/L (ref 0–44)
AST: 14 U/L — ABNORMAL LOW (ref 15–41)
Albumin: 4 g/dL (ref 3.5–5.0)
Alkaline Phosphatase: 62 U/L (ref 38–126)
Anion gap: 9 (ref 5–15)
BUN: 26 mg/dL — ABNORMAL HIGH (ref 8–23)
CO2: 24 mmol/L (ref 22–32)
Calcium: 8.8 mg/dL — ABNORMAL LOW (ref 8.9–10.3)
Chloride: 105 mmol/L (ref 98–111)
Creatinine: 1.41 mg/dL — ABNORMAL HIGH (ref 0.61–1.24)
GFR, Estimated: 50 mL/min — ABNORMAL LOW (ref >60)
Glucose, Bld: 131 mg/dL — ABNORMAL HIGH (ref 70–99)
Potassium: 4.2 mmol/L (ref 3.5–5.1)
Sodium: 138 mmol/L (ref 135–145)
Total Bilirubin: 0.5 mg/dL (ref 0.3–1.2)
Total Protein: 6.7 g/dL (ref 6.5–8.1)

## 2023-08-02 LAB — CBC WITH DIFFERENTIAL (CANCER CENTER ONLY)
Abs Immature Granulocytes: 0.03 10*3/uL (ref 0.00–0.07)
Basophils Absolute: 0 10*3/uL (ref 0.0–0.1)
Basophils Relative: 1 %
Eosinophils Absolute: 0.5 10*3/uL (ref 0.0–0.5)
Eosinophils Relative: 8 %
HCT: 34.2 % — ABNORMAL LOW (ref 39.0–52.0)
Hemoglobin: 11.1 g/dL — ABNORMAL LOW (ref 13.0–17.0)
Immature Granulocytes: 1 %
Lymphocytes Relative: 20 %
Lymphs Abs: 1.3 10*3/uL (ref 0.7–4.0)
MCH: 28.5 pg (ref 26.0–34.0)
MCHC: 32.5 g/dL (ref 30.0–36.0)
MCV: 87.7 fL (ref 80.0–100.0)
Monocytes Absolute: 0.5 10*3/uL (ref 0.1–1.0)
Monocytes Relative: 7 %
Neutro Abs: 4.1 10*3/uL (ref 1.7–7.7)
Neutrophils Relative %: 63 %
Platelet Count: 232 10*3/uL (ref 150–400)
RBC: 3.9 MIL/uL — ABNORMAL LOW (ref 4.22–5.81)
RDW: 14.5 % (ref 11.5–15.5)
WBC Count: 6.5 10*3/uL (ref 4.0–10.5)
nRBC: 0 % (ref 0.0–0.2)

## 2023-08-02 MED ORDER — IRON SUCROSE 20 MG/ML IV SOLN
200.0000 mg | Freq: Once | INTRAVENOUS | Status: AC
  Administered 2023-08-02: 200 mg via INTRAVENOUS
  Filled 2023-08-02: qty 10

## 2023-08-02 NOTE — Progress Notes (Signed)
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  His pretreatment PSA was 9.  S/p  IMRT to his prostate.  He has never undergone radical prostatectomy.His PSA subsequently went down to 0.2 in 2015.  More recently when patient had his blood work done his PSA was elevated at 1.6 and therefore patient has been referred to [place].  It appears that patient has never received any antiandrogen therapy in the past.    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      Prostate cancer (HCC)  04/24/2021 Initial Diagnosis   Prostate cancer (HCC)   04/24/2021 Cancer Staging   Staging form: Prostate, AJCC 8th Edition - Clinical stage from 04/24/2021: Stage IIB (cT1c, cN0, cM0, PSA: 9, Grade Group: 2) - Signed by Creig Hines, MD on 04/24/2021 Prostate specific antigen (PSA) range: Less than 10 Gleason score: 7 Histologic grading system: 5 grade system   10/18/2021 Cancer Staging   Staging form: Prostate, AJCC 8th Edition - Pathologic: Stage IVB (pM1a) - Signed by Earna Coder, MD on 10/18/2021     HISTORY OF PRESENTING ILLNESS: Alone.  Ambulating independently.  Devon Anderson 82 y.o. male above history of castrate sensitive metastatic prostate cancer on eligard intermittently because of side effects is here for consideration of IV Iron. He felt better after receiving IV iron but not back to normal. He believes he needs additional iron. Energy comes and goes. He's had some good days. He has appt with cardiology but hasn't  occurred yet.   Review of Systems  Constitutional:  Positive for malaise/fatigue. Negative for chills, diaphoresis, fever and weight loss.  HENT:  Negative for nosebleeds and sore throat.   Eyes:  Negative for double vision.  Respiratory:  Negative for cough, hemoptysis and sputum production.   Cardiovascular:  Negative for chest pain, palpitations, orthopnea and leg swelling.  Gastrointestinal:  Negative for abdominal pain, blood in stool, constipation, diarrhea, heartburn, melena, nausea and vomiting.  Genitourinary:  Negative for dysuria, frequency and urgency.  Musculoskeletal:  Negative for back pain and joint pain.  Skin:  Negative for itching and rash.  Neurological:  Negative for dizziness, tingling, focal weakness, weakness and headaches.  Endo/Heme/Allergies:  Does not bruise/bleed easily.  Psychiatric/Behavioral:  Negative for depression. The patient is not nervous/anxious and does not have insomnia.     MEDICAL HISTORY:  Past Medical History:  Diagnosis Date   Diabetes mellitus, type 2 (HCC)    Erectile dysfunction    Exogenous obesity    Hx of gout    Hyperlipidemia    Hypertension    Hypokalemia    Prostate cancer (HCC)    SURGICAL HISTORY: History reviewed. No pertinent surgical history.  SOCIAL HISTORY: Social History   Socioeconomic History   Marital status: Unknown    Spouse name: Not on file   Number of children: Not on file   Years of education: Not on file   Highest education level: Not on file  Occupational History   Not on file  Tobacco Use   Smoking status:  Former    Types: Cigarettes   Smokeless tobacco: Never  Vaping Use   Vaping status: Never Used  Substance and Sexual Activity   Alcohol use: Not on file   Drug use: Not on file   Sexual activity: Not on file  Other Topics Concern   Not on file  Social History Narrative   Not on file   Social Determinants of Health   Financial Resource Strain: Low Risk  (07/31/2022)   Received from  [facility], [facility]   Overall Financial Resource Strain (CARDIA)    Difficulty of Paying Living Expenses: Not hard at all  Food Insecurity: No Food Insecurity (07/31/2022)   Received from [facility], [facility]   Hunger Vital Sign    Worried About Running Out of Food in the Last Year: Never true    Ran Out of Food in the Last Year: Never true  Transportation Needs: No Transportation Needs (07/31/2022)   Received from [facility], [facility]   [facility] - Transportation    Lack of Transportation (Medical): No    Lack of Transportation (Non-Medical): No  Physical Activity: Not on file  Stress: Not on file  Social Connections: Not on file  Intimate Partner Violence: Not on file    FAMILY HISTORY: Family History  Problem Relation Age of Onset   Diabetes Father     ALLERGIES:  has No Known Allergies.  MEDICATIONS:  Current Outpatient Medications  Medication Sig Dispense Refill   amLODipine (NORVASC) 10 MG tablet Take 10 mg by mouth daily.     aspirin 81 MG EC tablet Take by mouth.     atenolol (TENORMIN) 100 MG tablet Take 100 mg by mouth daily.     atorvastatin (LIPITOR) 80 MG tablet Take 80 mg by mouth at bedtime.     cyanocobalamin (VITAMIN B12) 1000 MCG tablet Take 1,000 mcg by mouth daily.     docusate sodium (COLACE) 100 MG capsule Take 100 mg by mouth 2 (two) times daily.     glipiZIDE (GLUCOTROL) 5 MG tablet Take by mouth.     lisinopril-hydrochlorothiazide (ZESTORETIC) 20-12.5 MG tablet Take 1 tablet by mouth 2 (two) times daily.     Melatonin 10 MG TABS Take 10 mg by mouth at bedtime as needed.     metFORMIN (GLUCOPHAGE) 1000 MG tablet Take 1,000 mg by mouth 2 (two) times daily.     niacin (SLO-NIACIN) 500 MG tablet Take by mouth.     omeprazole (PRILOSEC) 40 MG capsule Take 40 mg by mouth 2 (two) times daily.     potassium chloride (KLOR-CON) 10 MEQ tablet Take 10 mEq by mouth daily.     tamsulosin (FLOMAX) 0.4 MG CAPS capsule Take 1  capsule (0.4 mg total) by mouth daily after supper. 90 capsule 1   zolpidem (AMBIEN) 5 MG tablet Take 1 tablet (5 mg total) by mouth at bedtime as needed for sleep. 30 tablet 0   No current facility-administered medications for this visit.     PHYSICAL EXAMINATION: Vitals:   08/02/23 1458  BP: (!) 147/87  Pulse: 65  Temp: (!) 96.7 F (35.9 C)  SpO2: 98%   Filed Weights   08/02/23 1458  Weight: 200 lb (90.7 kg)   Physical Exam Vitals reviewed.  Constitutional:      Appearance: He is not ill-appearing.  HENT:     Head: Normocephalic and atraumatic.  Pulmonary:     Comments: Decreased breath sounds bilaterally.  Abdominal:     Palpations: Abdomen is soft.  Musculoskeletal:     Cervical back: Normal range of motion.  Skin:    General: Skin is warm.     Coloration: Skin is not pale.  Neurological:     Mental Status: He is alert and oriented to person, place, and time.  Psychiatric:        Mood and Affect: Mood normal.        Behavior: Behavior normal.     LABORATORY DATA:  I have reviewed the data as listed Lab Results  Component Value Date   WBC 6.5 08/02/2023   HGB 11.1 (L) 08/02/2023   HCT 34.2 (L) 08/02/2023   MCV 87.7 08/02/2023   PLT 232 08/02/2023   Recent Labs    02/15/23 0754 06/18/23 0759 08/02/23 1447  NA 137 134* 138  K 3.8 3.8 4.2  CL 98 103 105  CO2 27 26 24   GLUCOSE 251* 119* 131*  BUN 23 19 26*  CREATININE 1.22 1.10 1.41*  CALCIUM 9.2 8.7* 8.8*  GFRNONAA 60* >60 50*  PROT 7.0 6.7 6.7  ALBUMIN 3.9 3.5 4.0  AST 18 11* 14*  ALT 20 13 14   ALKPHOS 67 54 62  BILITOT 0.8 1.0 0.5   Iron/TIBC/Ferritin/ %Sat    Component Value Date/Time   IRON 48 06/18/2023 0759   TIBC 301 06/18/2023 0759   FERRITIN 95 06/18/2023 0759   IRONPCTSAT 16 (L) 06/18/2023 0759     RADIOGRAPHIC STUDIES: I have personally reviewed the radiological images as listed and agreed with the findings in the report. No results found.   Assessment & Plan:   #  Recurrent castrate sensitive prostate cancer/stage IV- PET JAN 11th, 2023- JAN- Solitary enlarged radiotracer avid RIGHT external iliac lymph node consistent with prostate cancer nodal metastasis;  No evidence of local recurrence within the prostate gland/or metastatic disease. PSA December 2022-3.96. Status post Eligard in [facility] Feb 2023 [45 mg q6M]. He had good response to ADT and based on side effects, it is currently on hold. We are monitoring his psa.    # Worsening fatigue- ? Low testosterone. He was referred to cardiology and is awaiting visit.    # Mild anemia- April 2024 [melena] s/p EGD-EGD showed gastric ulcers- [[facility]]- EGD x2 [summer 2024] currently on gentle iron [iron biglycinate; 28 mg ] 1 pill a day-  Hb 10. He received venofer x 2. Tolerated well. Hemoglobin has improved to 11.1. Ongoing symptoms but improving. Recommend venofer x 2.    # increased frequency of urination [no urologist]: on  flomax- stable.    # DM- FBG- 131- recommend monitoring closely at home- stable.    # Elevated BP [at home 130/75]- monitor for now.    # Hot flashes-G-1-2. Monitor for now .  Stable.     # ? CKD II/III -Creatnine- GFR  58-60- Stable.     # Fatigue: discussed re: CARE program; will consider at next visit.      # Insomnia- ambien prn at bedtime [will refill]; future refills- need to follow up with PCP; ; discussed the risk of falls.    DISPOSITION: Venofer today Venofer next week Rtc as scheduled- la  No problem-specific Assessment & Plan notes found for this encounter.  All questions were answered. The patient knows to call the clinic with any problems, questions or concerns.   Alinda Dooms, NP 08/02/2023

## 2023-08-09 ENCOUNTER — Inpatient Hospital Stay: Payer: Medicare HMO

## 2023-08-09 VITALS — BP 163/95 | HR 57 | Temp 96.7°F | Resp 16

## 2023-08-09 DIAGNOSIS — C61 Malignant neoplasm of prostate: Secondary | ICD-10-CM

## 2023-08-09 MED ORDER — IRON SUCROSE 20 MG/ML IV SOLN
200.0000 mg | Freq: Once | INTRAVENOUS | Status: AC
  Administered 2023-08-09: 200 mg via INTRAVENOUS
  Filled 2023-08-09: qty 10

## 2023-08-09 NOTE — Progress Notes (Signed)
Pt refused 30 minute post observation.  Pt tolerated treatment well, VSS.  Pt understands risks.

## 2023-08-09 NOTE — Patient Instructions (Signed)
Iron Sucrose Injection What is this medication? IRON SUCROSE (EYE ern SOO krose) treats low levels of iron (iron deficiency anemia) in people with kidney disease. Iron is a mineral that plays an important role in making red blood cells, which carry oxygen from your lungs to the rest of your body. This medicine may be used for other purposes; ask your health care provider or pharmacist if you have questions. COMMON BRAND NAME(S): Venofer What should I tell my care team before I take this medication? They need to know if you have any of these conditions: Anemia not caused by low iron levels Heart disease High levels of iron in the blood Kidney disease Liver disease An unusual or allergic reaction to iron, other medications, foods, dyes, or preservatives Pregnant or trying to get pregnant Breastfeeding How should I use this medication? This medication is for infusion into a vein. It is given in a hospital or clinic setting. Talk to your care team about the use of this medication in children. While this medication may be prescribed for children as young as 2 years for selected conditions, precautions do apply. Overdosage: If you think you have taken too much of this medicine contact a poison control center or emergency room at once. NOTE: This medicine is only for you. Do not share this medicine with others. What if I miss a dose? Keep appointments for follow-up doses. It is important not to miss your dose. Call your care team if you are unable to keep an appointment. What may interact with this medication? Do not take this medication with any of the following: Deferoxamine Dimercaprol Other iron products This medication may also interact with the following: Chloramphenicol Deferasirox This list may not describe all possible interactions. Give your health care provider a list of all the medicines, herbs, non-prescription drugs, or dietary supplements you use. Also tell them if you smoke,  drink alcohol, or use illegal drugs. Some items may interact with your medicine. What should I watch for while using this medication? Visit your care team regularly. Tell your care team if your symptoms do not start to get better or if they get worse. You may need blood work done while you are taking this medication. You may need to follow a special diet. Talk to your care team. Foods that contain iron include: whole grains/cereals, dried fruits, beans, or peas, leafy green vegetables, and organ meats (liver, kidney). What side effects may I notice from receiving this medication? Side effects that you should report to your care team as soon as possible: Allergic reactions--skin rash, itching, hives, swelling of the face, lips, tongue, or throat Low blood pressure--dizziness, feeling faint or lightheaded, blurry vision Shortness of breath Side effects that usually do not require medical attention (report to your care team if they continue or are bothersome): Flushing Headache Joint pain Muscle pain Nausea Pain, redness, or irritation at injection site This list may not describe all possible side effects. Call your doctor for medical advice about side effects. You may report side effects to FDA at 1-800-FDA-1088. Where should I keep my medication? This medication is given in a hospital or clinic. It will not be stored at home. NOTE: This sheet is a summary. It may not cover all possible information. If you have questions about this medicine, talk to your doctor, pharmacist, or health care provider.  2024 Elsevier/Gold Standard (2023-02-22 00:00:00)

## 2023-09-06 ENCOUNTER — Encounter: Payer: Self-pay | Admitting: Internal Medicine

## 2023-09-10 ENCOUNTER — Encounter: Payer: Self-pay | Admitting: Cardiology

## 2023-09-10 ENCOUNTER — Ambulatory Visit: Payer: Medicare HMO | Attending: Cardiology | Admitting: Cardiology

## 2023-09-10 VITALS — BP 132/68 | HR 66 | Ht 70.0 in | Wt 195.4 lb

## 2023-09-10 DIAGNOSIS — I10 Essential (primary) hypertension: Secondary | ICD-10-CM

## 2023-09-10 DIAGNOSIS — I4891 Unspecified atrial fibrillation: Secondary | ICD-10-CM

## 2023-09-10 DIAGNOSIS — R0600 Dyspnea, unspecified: Secondary | ICD-10-CM | POA: Diagnosis not present

## 2023-09-10 DIAGNOSIS — E7800 Pure hypercholesterolemia, unspecified: Secondary | ICD-10-CM | POA: Diagnosis not present

## 2023-09-10 MED ORDER — APIXABAN 5 MG PO TABS
5.0000 mg | ORAL_TABLET | Freq: Two times a day (BID) | ORAL | 3 refills | Status: AC
Start: 2023-09-10 — End: ?

## 2023-09-10 NOTE — Patient Instructions (Signed)
Medication Instructions:   STOP Aspirin  START Eliquis - Take one tablet ( 5mg ) by mouth twice a day.   *If you need a refill on your cardiac medications before your next appointment, please call your pharmacy*   Lab Work:  None Ordered  If you have labs (blood work) drawn today and your tests are completely normal, you will receive your results only by: MyChart Message (if you have MyChart) OR A paper copy in the mail If you have any lab test that is abnormal or we need to change your treatment, we will call you to review the results.   Testing/Procedures:  Your physician has requested that you have an echocardiogram. Echocardiography is a painless test that uses sound waves to create images of your heart. It provides your doctor with information about the size and shape of your heart and how well your heart's chambers and valves are working. This procedure takes approximately one hour. There are no restrictions for this procedure. Please do NOT wear cologne, perfume, aftershave, or lotions (deodorant is allowed). Please arrive 15 minutes prior to your appointment time.  Please note: We ask at that you not bring children with you during ultrasound (echo/ vascular) testing. Due to room size and safety concerns, children are not allowed in the ultrasound rooms during exams. Our front office staff cannot provide observation of children in our lobby area while testing is being conducted. An adult accompanying a patient to their appointment will only be allowed in the ultrasound room at the discretion of the ultrasound technician under special circumstances. We apologize for any inconvenience.    Follow-Up: At [facility], you and your health needs are our priority.  As part of our continuing mission to provide you with exceptional heart care, we have created designated Provider Care Teams.  These Care Teams include your primary Cardiologist (physician) and Advanced Practice  Providers (APPs -  Physician Assistants and Nurse Practitioners) who all work together to provide you with the care you need, when you need it.  We recommend signing up for the patient portal called "MyChart".  Sign up information is provided on this After Visit Summary.  MyChart is used to connect with patients for Virtual Visits (Telemedicine).  Patients are able to view lab/test results, encounter notes, upcoming appointments, etc.  Non-urgent messages can be sent to your provider as well.   To learn more about what you can do with MyChart, go to [email or web address].    Your next appointment:    After Echocardiogram  Provider:   You may see Debbe Odea, MD or one of the following Advanced Practice Providers on your designated Care Team:   Nicolasa Ducking, NP Eula Listen, PA-C Cadence Fransico Michael, PA-C Charlsie Quest, NP Carlos Levering, NP

## 2023-09-10 NOTE — Progress Notes (Signed)
Cardiology Office Note:    Date:  09/10/2023   ID:  [name], DOB [birth year], MRN [id number]  PCP:  Driggers, Devon Anderson   [facility] Providers Cardiologist:  None     Referring MD: Driggers, Gerome Apley, PA-C   Chief Complaint  Patient presents with   New Patient (Initial Visit)    Referred for cardiac evaluation of shortness of breath.  Patient reports SOBr on exertion for past several months with increase in fatigue.  No personal cardiac history    History of Present Illness:    Devon Anderson is a 82 y.o. male with a hx of hypertension, hyperlipidemia, diabetes, prostate cancer s/p RT with recurrence, now stage IV (right external iliac nodal mets) who presents with shortness of breath.  Patient with history of prostate cancer initially diagnosed in 2022 previously underwent radiation therapy.  Lately noted to have elevated PSA, workup via PET scan revealed iliac nodal mets, started on hormonal agent with good response per oncology team.    States having worsening shortness of breath ongoing over the past 1-2 years.  Symptoms are worse with exertion.  Endorses fatigue.  Denies chest pain or palpitations.  Has noticed irregular heartbeats on his blood pressure device during BP checks at home.  Endorses dizziness but denies syncope.  Denies any personal or family history of heart disease.  Denies edema.  Past Medical History:  Diagnosis Date   Diabetes mellitus, type 2 (HCC)    Erectile dysfunction    Exogenous obesity    Hx of gout    Hyperlipidemia    Hypertension    Hypokalemia    Prostate cancer (HCC)     Past Surgical History:  Procedure Laterality Date   CHOLECYSTECTOMY      Current Medications: Current Meds  Medication Sig   amLODipine (NORVASC) 10 MG tablet Take 10 mg by mouth daily.   aspirin 81 MG EC tablet Take by mouth.   atenolol (TENORMIN) 100 MG tablet Take 100 mg by mouth daily.   atorvastatin (LIPITOR) 80 MG tablet Take 80 mg by  mouth at bedtime.   cyanocobalamin (VITAMIN B12) 1000 MCG tablet Take 1,000 mcg by mouth daily.   ferrous sulfate 324 MG TBEC Take 324 mg by mouth.   glipiZIDE (GLUCOTROL) 5 MG tablet Take by mouth.   Melatonin 10 MG TABS Take 10 mg by mouth at bedtime as needed.   metFORMIN (GLUCOPHAGE) 1000 MG tablet Take 1,000 mg by mouth 2 (two) times daily.   niacin (SLO-NIACIN) 500 MG tablet Take by mouth.   omeprazole (PRILOSEC) 40 MG capsule Take 40 mg by mouth 2 (two) times daily.   potassium chloride (KLOR-CON) 10 MEQ tablet Take 10 mEq by mouth daily.   tamsulosin (FLOMAX) 0.4 MG CAPS capsule Take 1 capsule (0.4 mg total) by mouth daily after supper.   zolpidem (AMBIEN) 5 MG tablet Take 1 tablet (5 mg total) by mouth at bedtime as needed for sleep.     Allergies:   Patient has no known allergies.   Social History   Socioeconomic History   Marital status: Widowed    Spouse name: Not on file   Number of children: Not on file   Years of education: Not on file   Highest education level: Not on file  Occupational History   Not on file  Tobacco Use   Smoking status: Former    Types: Cigarettes   Smokeless tobacco: Never  Vaping Use   Vaping  status: Never Used  Substance and Sexual Activity   Alcohol use: Never   Drug use: Never   Sexual activity: Not on file  Other Topics Concern   Not on file  Social History Narrative   Not on file   Social Determinants of Health   Financial Resource Strain: Low Risk  (07/31/2022)   Received from [facility], [facility]   Overall Financial Resource Strain (CARDIA)    Difficulty of Paying Living Expenses: Not hard at all  Food Insecurity: No Food Insecurity (07/31/2022)   Received from [facility], [facility]   Hunger Vital Sign    Worried About Running Out of Food in the Last Year: Never true    Ran Out of Food in the Last Year: Never true  Transportation Needs: No Transportation Needs (07/31/2022)   Received from [facility], [facility]   [facility] - Transportation    Lack of Transportation (Medical): No    Lack of Transportation (Non-Medical): No  Physical Activity: Not on file  Stress: Not on file  Social Connections: Not on file     Family History: The patient's family history includes Diabetes in his father.  ROS:   Please see the history of present illness.     All other systems reviewed and are negative.  EKGs/Labs/Other Studies Reviewed:    The following studies were reviewed today:  EKG Interpretation Date/Time:  Tuesday September 10 2023 08:42:41 EST Ventricular Rate:  66 PR Interval:    QRS Duration:  94 QT Interval:  386 QTC Calculation: 404 R Axis:   53  Text Interpretation: Atrial fibrillation with a competing junctional pacemaker Nonspecific ST and T wave abnormality Confirmed by Debbe Odea ([id number]) on 09/10/2023 8:51:55 AM    Recent Labs: 08/02/2023: ALT 14; BUN 26; Creatinine 1.41; Hemoglobin 11.1; Platelet Count 232; Potassium 4.2; Sodium 138  Recent Lipid Panel No results found for: "CHOL", "TRIG", "HDL", "CHOLHDL", "VLDL", "LDLCALC", "LDLDIRECT"   Risk Assessment/Calculations:             Physical Exam:    VS:  BP 132/68 (BP Location: Left Arm, Patient Position: Sitting, Cuff Size: Large)   Pulse 66   Ht 5\' 10"  (1.778 m)   Wt 195 lb 6.4 oz (88.6 kg)   SpO2 97%   BMI 28.04 kg/m     Wt Readings from Last 3 Encounters:  09/10/23 195 lb 6.4 oz (88.6 kg)  08/02/23 200 lb (90.7 kg)  06/25/23 198 lb 3.2 oz (89.9 kg)     GEN:  Well nourished, well developed in no acute distress HEENT: Normal NECK: No JVD; No carotid bruits CARDIAC: Irregular irregular, no murmurs RESPIRATORY:  Clear to auscultation without rales, wheezing or rhonchi  ABDOMEN: Soft, non-tender, non-distended MUSCULOSKELETAL:  No edema; No deformity  SKIN: Warm and dry NEUROLOGIC:  Alert and oriented x 3 PSYCHIATRIC:  Normal affect   ASSESSMENT:    1. Atrial fibrillation,  unspecified type (HCC)   2. Dyspnea, unspecified type   3. Primary hypertension   4. Pure hypercholesterolemia    PLAN:    In order of problems listed above:  Atrial fibrillation, patient endorses fatigue.  CHA2DS2-VASc 4.  Heart rate controlled.  Start Eliquis 5 mg twice daily, continue atenolol 100 mg daily.  Get echocardiogram.  Refer to A-fib clinic.  Plan DC cardioversion at follow-up visit if still in A-fib after at least 4 weeks of anticoagulation.  Stop aspirin. Dyspnea, echocardiogram, management of  A-fib as above.  If symptoms persist despite A-fib management, will consider an ischemic workup. Hypertension, BP controlled.  Continue Norvasc 10 mg daily, Zestoretic, atenolol. Hyperlipidemia, Lipitor 80.  Follow-up in 4 to 6 weeks.  Plan DC cardioversion if still in A-fib.      Medication Adjustments/Labs and Tests Ordered: Current medicines are reviewed at length with the patient today.  Concerns regarding medicines are outlined above.  Orders Placed This Encounter  Procedures   EKG 12-Lead   No orders of the defined types were placed in this encounter.   There are no Patient Instructions on file for this visit.   Signed, Debbe Odea, MD  09/10/2023 8:52 AM    [facility]

## 2023-09-29 NOTE — Progress Notes (Signed)
 Cardiology Clinic Note   Date: 10/03/2023 ID: RHYTHM ALATORRE, DOB 08-30-1941, MRN [id number]  Primary Cardiologist:  Devon Odea, MD  Patient Profile    Devon Anderson is a 82 y.o. male who presents to the clinic today for afib follow up.     Past medical history significant for: Dyspnea. Echo 10/01/2023: EF 55 to 60%.  No RWMA.  Mild LVH.  Indeterminate diastolic parameters.  Normal RV size/function.  Aortic valve sclerosis/calcification without stenosis.  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  Normal size atria. PAF. Hypertension. Hyperlipidemia. Prostate cancer. Initial diagnosis 2022 with radiation. Recurrence 2024 on hormonal treatment.  In summary, patient was first evaluated by Dr. Azucena Anderson on 09/10/2023 for DOE and increased fatigue at the request of Devon Mohair, PA-C.  Patient reported a 1 to 2-year history of worsening DOE and fatigue.  He noted irregular heartbeats on blood pressure device at home.  He reported associated dizziness without syncope.  EKG performed during office visit revealed A-fib, 66 bpm.  He was instructed to stop aspirin and was started on Eliquis.  Echo showed normal LV/RV function as detailed above.      History of Present Illness    Devon Anderson is followed by Dr. Azucena Anderson for the above outlined history.   Today, patient is accompanied by his daughter. He is doing well. He continues to have mild dyspnea with exertion that resolves quickly with rest. He is also very fatigued. He typically does not have any awareness of arrhythmia. At night he will sometimes feel his heart beat is irregular. He reports mild lower extremity edema that is normal in the morning and progresses throughout the day. No orthopnea or PND. He does not restrict sodium. He denies missing any doses of Eliquis. No blood in stool or urine.     ROS: All other systems reviewed and are otherwise negative except as noted in History of Present Illness.  EKGs/Labs  Reviewed    EKG Interpretation Date/Time:  Thursday October 03 2023 08:04:13 EST Ventricular Rate:  58 PR Interval:    QRS Duration:  94 QT Interval:  438 QTC Calculation: 429 R Axis:   46  Text Interpretation: Atrial fibrillation with slow ventricular response Nonspecific T wave abnormality When compared with ECG of 10-Sep-2023 08:42, No significant change was found Confirmed by Devon Anderson [phone]) on 10/03/2023 8:10:01 AM   08/02/2023: ALT 14; AST 14; BUN 26; Creatinine 1.41; Potassium 4.2; Sodium 138   08/02/2023: Hemoglobin 11.1; WBC Count 6.5    Risk Assessment/Calculations     CHA2DS2-VASc Score = 3   This indicates a 3.2% annual risk of stroke. The patient's score is based upon: CHF History: 0 HTN History: 1 Diabetes History: 0 Stroke History: 0 Vascular Disease History: 0 Age Score: 2 Gender Score: 0             Physical Exam    VS:  BP 120/70 (BP Location: Left Arm, Patient Position: Sitting, Cuff Size: Normal)   Pulse (!) 58   Ht 5\' 10"  (1.778 m)   Wt 194 lb 8 oz (88.2 kg)   SpO2 98%   BMI 27.91 kg/m  , BMI Body mass index is 27.91 kg/m.  GEN: Well nourished, well developed, in no acute distress. Neck: No JVD or carotid bruits. Cardiac: Irregular rhythm, regular rate. No murmurs. No rubs or gallops.   Respiratory:  Respirations regular and unlabored. Clear to auscultation without rales, wheezing or rhonchi. GI: Soft, nontender, nondistended. Extremities:  Radials/DP/PT 2+ and equal bilaterally. No clubbing or cyanosis. No edema.  Skin: Warm and dry, no rash. Neuro: Strength intact.  Assessment & Plan   Dyspnea/fatigue Echo 10/01/2023 showed normal LV/RV function, indeterminate diastolic parameters, normal size atria.  Patient continues to have mild dyspnea with exertion that resolves quickly with rest. He continues to fatigue easily and reports his energy level is "zero." Discussed results of echo and all questions answered.  -Continue to  monitor.   PAF New onset December 2024.  Patient denies awareness of arrhythmia. At night he will sometimes feel his heart is irregular. EKG shows afib with slow ventricular response, 58 bpm. No spontaneous bleeding concerns. He has not missed any doses of Eliquis. Stressed the importance of staying on Eliquis with no missed doses. He agrees to proceed with DCCV.  -Continue atenolol, Eliquis. Appropriate Eliquis dose. -CBC and BMP today.  -Schedule DCCV.  Hypertension BP today 120/70. No headaches reported. Occasional positional dizziness.  -Continue amlodipine, atenolol.  Disposition: CBC and BMP today. Schedule DCCV. Return for previously scheduled visit with Dr. Lalla Anderson or sooner as needed.       Informed Consent   Shared Decision Making/Informed Consent The risks (stroke, cardiac arrhythmias rarely resulting in the need for a temporary or permanent pacemaker, skin irritation or burns and complications associated with conscious sedation including aspiration, arrhythmia, respiratory failure and death), benefits (restoration of normal sinus rhythm) and alternatives of a direct current cardioversion were explained in detail to Mr. Devon Anderson and he agrees to proceed.        Signed, Devon Anderson. Devon Laris, DNP, NP-C

## 2023-09-29 NOTE — H&P (View-Only) (Signed)
Cardiology Clinic Note   Date: 10/03/2023 ID: Devon Anderson, DOB 08-30-1941, MRN [id number]  Primary Cardiologist:  Debbe Odea, MD  Patient Profile    Devon Anderson is a 82 y.o. male who presents to the clinic today for afib follow up.     Past medical history significant for: Dyspnea. Echo 10/01/2023: EF 55 to 60%.  No RWMA.  Mild LVH.  Indeterminate diastolic parameters.  Normal RV size/function.  Aortic valve sclerosis/calcification without stenosis.  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  Normal size atria. PAF. Hypertension. Hyperlipidemia. Prostate cancer. Initial diagnosis 2022 with radiation. Recurrence 2024 on hormonal treatment.  In summary, patient was first evaluated by Dr. Azucena Cecil on 09/10/2023 for DOE and increased fatigue at the request of Thomasene Mohair, PA-C.  Patient reported a 1 to 2-year history of worsening DOE and fatigue.  He noted irregular heartbeats on blood pressure device at home.  He reported associated dizziness without syncope.  EKG performed during office visit revealed A-fib, 66 bpm.  He was instructed to stop aspirin and was started on Eliquis.  Echo showed normal LV/RV function as detailed above.      History of Present Illness    Devon Anderson is followed by Dr. Azucena Cecil for the above outlined history.   Today, patient is accompanied by his daughter. He is doing well. He continues to have mild dyspnea with exertion that resolves quickly with rest. He is also very fatigued. He typically does not have any awareness of arrhythmia. At night he will sometimes feel his heart beat is irregular. He reports mild lower extremity edema that is normal in the morning and progresses throughout the day. No orthopnea or PND. He does not restrict sodium. He denies missing any doses of Eliquis. No blood in stool or urine.     ROS: All other systems reviewed and are otherwise negative except as noted in History of Present Illness.  EKGs/Labs  Reviewed    EKG Interpretation Date/Time:  Thursday October 03 2023 08:04:13 EST Ventricular Rate:  58 PR Interval:    QRS Duration:  94 QT Interval:  438 QTC Calculation: 429 R Axis:   46  Text Interpretation: Atrial fibrillation with slow ventricular response Nonspecific T wave abnormality When compared with ECG of 10-Sep-2023 08:42, No significant change was found Confirmed by Carlos Levering [phone]) on 10/03/2023 8:10:01 AM   08/02/2023: ALT 14; AST 14; BUN 26; Creatinine 1.41; Potassium 4.2; Sodium 138   08/02/2023: Hemoglobin 11.1; WBC Count 6.5    Risk Assessment/Calculations     CHA2DS2-VASc Score = 3   This indicates a 3.2% annual risk of stroke. The patient's score is based upon: CHF History: 0 HTN History: 1 Diabetes History: 0 Stroke History: 0 Vascular Disease History: 0 Age Score: 2 Gender Score: 0             Physical Exam    VS:  BP 120/70 (BP Location: Left Arm, Patient Position: Sitting, Cuff Size: Normal)   Pulse (!) 58   Ht 5\' 10"  (1.778 m)   Wt 194 lb 8 oz (88.2 kg)   SpO2 98%   BMI 27.91 kg/m  , BMI Body mass index is 27.91 kg/m.  GEN: Well nourished, well developed, in no acute distress. Neck: No JVD or carotid bruits. Cardiac: Irregular Devon, regular rate. No murmurs. No rubs or gallops.   Respiratory:  Respirations regular and unlabored. Clear to auscultation without rales, wheezing or rhonchi. GI: Soft, nontender, nondistended. Extremities:  Radials/DP/PT 2+ and equal bilaterally. No clubbing or cyanosis. No edema.  Skin: Warm and dry, no rash. Neuro: Strength intact.  Assessment & Plan   Dyspnea/fatigue Echo 10/01/2023 showed normal LV/RV function, indeterminate diastolic parameters, normal size atria.  Patient continues to have mild dyspnea with exertion that resolves quickly with rest. He continues to fatigue easily and reports his energy level is "zero." Discussed results of echo and all questions answered.  -Continue to  monitor.   PAF New onset December 2024.  Patient denies awareness of arrhythmia. At night he will sometimes feel his heart is irregular. EKG shows afib with slow ventricular response, 58 bpm. No spontaneous bleeding concerns. He has not missed any doses of Eliquis. Stressed the importance of staying on Eliquis with no missed doses. He agrees to proceed with DCCV.  -Continue atenolol, Eliquis. Appropriate Eliquis dose. -CBC and BMP today.  -Schedule DCCV.  Hypertension BP today 120/70. No headaches reported. Occasional positional dizziness.  -Continue amlodipine, atenolol.  Disposition: CBC and BMP today. Schedule DCCV. Return for previously scheduled visit with Dr. Lalla Brothers or sooner as needed.       Informed Consent   Shared Decision Making/Informed Consent The risks (stroke, cardiac arrhythmias rarely resulting in the need for a temporary or permanent pacemaker, skin irritation or burns and complications associated with conscious sedation including aspiration, arrhythmia, respiratory failure and death), benefits (restoration of normal sinus Devon) and alternatives of a direct current cardioversion were explained in detail to [name] and he agrees to proceed.        Signed, Etta Grandchild. Wretha Laris, DNP, NP-C

## 2023-10-01 ENCOUNTER — Ambulatory Visit: Payer: Medicare HMO | Attending: Cardiology

## 2023-10-01 DIAGNOSIS — I4891 Unspecified atrial fibrillation: Secondary | ICD-10-CM

## 2023-10-01 LAB — ECHOCARDIOGRAM COMPLETE
AR max vel: 2.07 cm2
AV Area VTI: 2.11 cm2
AV Area mean vel: 2.02 cm2
AV Mean grad: 5.6 mm[Hg]
AV Peak grad: 10 mm[Hg]
Ao pk vel: 1.58 m/s
Calc EF: 52.2 %
S' Lateral: 3.6 cm
Single Plane A2C EF: 52.1 %
Single Plane A4C EF: 53.8 %

## 2023-10-03 ENCOUNTER — Ambulatory Visit: Payer: Medicare HMO | Attending: Student | Admitting: Student

## 2023-10-03 ENCOUNTER — Encounter: Payer: Self-pay | Admitting: Student

## 2023-10-03 VITALS — BP 120/70 | HR 58 | Ht 70.0 in | Wt 194.5 lb

## 2023-10-03 DIAGNOSIS — R0609 Other forms of dyspnea: Secondary | ICD-10-CM | POA: Diagnosis not present

## 2023-10-03 DIAGNOSIS — R5383 Other fatigue: Secondary | ICD-10-CM | POA: Diagnosis not present

## 2023-10-03 DIAGNOSIS — I480 Paroxysmal atrial fibrillation: Secondary | ICD-10-CM | POA: Diagnosis not present

## 2023-10-03 DIAGNOSIS — I10 Essential (primary) hypertension: Secondary | ICD-10-CM | POA: Diagnosis not present

## 2023-10-03 NOTE — Patient Instructions (Addendum)
 Medication Instructions:  Your Physician recommend you continue on your current medication as directed.    *If you need a refill on your cardiac medications before your next appointment, please call your pharmacy*   Lab Work: Your provider would like for you to have following labs drawn today CBC, and BMET.   If you have labs (blood work) drawn today and your tests are completely normal, you will receive your results only by: MyChart Message (if you have MyChart) OR A paper copy in the mail If you have any lab test that is abnormal or we need to change your treatment, we will call you to review the results.   Testing/Procedures:   Dear Devon Anderson  You are scheduled for a Cardioversion on Wednesday, January 15 with Dr. Velinda Lunger, MD.  Please arrive at the [facility] Entrance of [facility], [address], [place] [address] at 6:30 AM (This is 1 hour(s) prior to your procedure time).  Proceed to the Check-In Desk directly inside the entrance.  Procedure Parking: Use the entrance off of the [address] side of the hospital. Turn right upon entering and follow the driveway to parking that is directly in front of the [facility] Center. There is no valet parking available at this entrance, however there is an awning directly in front of the [facility]ascular Center for drop off/ pick up for patients.    DIET:  Nothing to eat or drink after midnight except a sip of water with medications (see medication instructions below)  MEDICATION INSTRUCTIONS: !!IF ANY NEW MEDICATIONS ARE STARTED AFTER TODAY, PLEASE NOTIFY YOUR PROVIDER AS SOON AS POSSIBLE!!  FYI: Medications such as Semaglutide (Ozempic, Wegovy), Tirzepatide (Mounjaro, Zepbound), Dulaglutide (Trulicity), etc (GLP1 agonists) AND Canagliflozin (Invokana), Dapagliflozin (Farxiga), Empagliflozin (Jardiance), Ertugliflozin (Steglatro), Bexagliflozin [employer]) or any combination with one of these drugs such as  Invokamet (Canagliflozin/Metformin), Synjardy (Empagliflozin/Metformin), etc (SGLT2 inhibitors) must be held around the time of a procedure. This is not a comprehensive list of all of these drugs. Please review all of your medications and talk to your provider if you take any one of these. If you are not sure, ask your provider.   Continue taking your anticoagulant (blood thinner): Apixaban  (Eliquis ).  You will need to continue this after your procedure until you are told by your provider that it is safe to stop.    HOLD: Metformin and Glipizide the morning of your procedure and take it after your procedure to keep your blood sugar levels from dropping.  LABS: Will be drawn today  FYI:  For your safety, and to allow us  to monitor your vital signs accurately during the surgery/procedure we request: If you have artificial nails, gel coating, SNS etc, please have those removed prior to your surgery/procedure. Not having the nail coverings /polish removed may result in cancellation or delay of your surgery/procedure.  You must have a responsible person to drive you home and stay in the waiting area during your procedure. Failure to do so could result in cancellation.  Bring your insurance cards.  *Special Note: Every effort is made to have your procedure done on time. Occasionally there are emergencies that occur at the hospital that may cause delays. Please be patient if a delay does occur.     Follow-Up: At [facility], you and your health needs are our priority.  As part of our continuing mission to provide you with exceptional heart care, we have created designated  Provider Care Teams.  These Care Teams include your primary Cardiologist (physician) and Advanced Practice Providers (APPs -  Physician Assistants and Nurse Practitioners) who all work together to provide you with the care you need, when you need it.  We recommend signing up for the patient portal called MyChart.   Sign up information is provided on this After Visit Summary.  MyChart is used to connect with patients for Virtual Visits (Telemedicine).  Patients are able to view lab/test results, encounter notes, upcoming appointments, etc.  Non-urgent messages can be sent to your provider as well.   To learn more about what you can do with MyChart, go to [email or web address].    Your next appointment:   6 month(s)  Provider:   You may see Redell Cave, MD or one of the following Advanced Practice Providers on your designated Care Team:   Lonni Meager, NP Bernardino Bring, PA-C Cadence Franchester, PA-C Tylene Lunch, NP Barnie Hila, NP

## 2023-10-04 ENCOUNTER — Encounter: Payer: Self-pay | Admitting: *Deleted

## 2023-10-04 LAB — CBC
Hematocrit: 37.1 % — ABNORMAL LOW (ref 37.5–51.0)
Hemoglobin: 12.2 g/dL — ABNORMAL LOW (ref 13.0–17.7)
MCH: 29 pg (ref 26.6–33.0)
MCHC: 32.9 g/dL (ref 31.5–35.7)
MCV: 88 fL (ref 79–97)
Platelets: 237 10*3/uL (ref 150–450)
RBC: 4.2 x10E6/uL (ref 4.14–5.80)
RDW: 13.6 % (ref 11.6–15.4)
WBC: 7.2 10*3/uL (ref 3.4–10.8)

## 2023-10-04 LAB — BASIC METABOLIC PANEL
BUN/Creatinine Ratio: 13 (ref 10–24)
BUN: 15 mg/dL (ref 8–27)
CO2: 23 mmol/L (ref 20–29)
Calcium: 9.1 mg/dL (ref 8.6–10.2)
Chloride: 102 mmol/L (ref 96–106)
Creatinine, Ser: 1.17 mg/dL (ref 0.76–1.27)
Glucose: 115 mg/dL — ABNORMAL HIGH (ref 70–99)
Potassium: 4 mmol/L (ref 3.5–5.2)
Sodium: 141 mmol/L (ref 134–144)
eGFR: 63 mL/min/{1.73_m2} (ref >59)

## 2023-10-15 MED ORDER — SODIUM CHLORIDE 0.9% FLUSH: 3.0000 mL | Freq: Two times a day (BID) | INTRAVENOUS | Status: DC

## 2023-10-15 MED ORDER — SODIUM CHLORIDE 0.9% FLUSH
3.0000 mL | INTRAVENOUS | Status: DC | PRN
  Administered 2023-10-16: 10 mL via INTRAVENOUS

## 2023-10-16 ENCOUNTER — Ambulatory Visit: Payer: Self-pay | Admitting: Anesthesiology

## 2023-10-16 ENCOUNTER — Encounter
Admission: RE | Disposition: A | Discharge status: Home / Self Care | Payer: Self-pay | Source: Home / Self Care | Attending: Cardiovascular Disease

## 2023-10-16 ENCOUNTER — Other Ambulatory Visit: Payer: Self-pay

## 2023-10-16 ENCOUNTER — Encounter: Payer: Self-pay | Admitting: Cardiovascular Disease

## 2023-10-16 ENCOUNTER — Ambulatory Visit
Admission: RE | Admit: 2023-10-16 | Discharge: 2023-10-16 | Disposition: A | Discharge status: Home / Self Care | Payer: Medicare HMO | Attending: Cardiovascular Disease | Admitting: Cardiovascular Disease

## 2023-10-16 DIAGNOSIS — Z87891 Personal history of nicotine dependence: Secondary | ICD-10-CM | POA: Diagnosis not present

## 2023-10-16 DIAGNOSIS — I10 Essential (primary) hypertension: Secondary | ICD-10-CM | POA: Diagnosis not present

## 2023-10-16 DIAGNOSIS — Z7901 Long term (current) use of anticoagulants: Secondary | ICD-10-CM | POA: Insufficient documentation

## 2023-10-16 DIAGNOSIS — Z8546 Personal history of malignant neoplasm of prostate: Secondary | ICD-10-CM | POA: Diagnosis not present

## 2023-10-16 DIAGNOSIS — R0602 Shortness of breath: Secondary | ICD-10-CM

## 2023-10-16 DIAGNOSIS — I4581 Long QT syndrome: Secondary | ICD-10-CM | POA: Diagnosis not present

## 2023-10-16 DIAGNOSIS — E119 Type 2 diabetes mellitus without complications: Secondary | ICD-10-CM | POA: Diagnosis not present

## 2023-10-16 DIAGNOSIS — E785 Hyperlipidemia, unspecified: Secondary | ICD-10-CM | POA: Insufficient documentation

## 2023-10-16 DIAGNOSIS — I480 Paroxysmal atrial fibrillation: Secondary | ICD-10-CM | POA: Diagnosis present

## 2023-10-16 DIAGNOSIS — I4891 Unspecified atrial fibrillation: Secondary | ICD-10-CM

## 2023-10-16 HISTORY — PX: CARDIOVERSION: SHX1299

## 2023-10-16 LAB — GLUCOSE, CAPILLARY: Glucose-Capillary: 126 mg/dL — ABNORMAL HIGH (ref 70–99)

## 2023-10-16 SURGERY — CARDIOVERSION
Anesthesia: General

## 2023-10-16 MED ORDER — LIDOCAINE HCL (CARDIAC) PF 100 MG/5ML IV SOSY
PREFILLED_SYRINGE | INTRAVENOUS | Status: DC | PRN
  Administered 2023-10-16: 60 mg via INTRAVENOUS

## 2023-10-16 MED ORDER — SODIUM CHLORIDE 0.9 % IV SOLN: INTRAVENOUS | Status: DC | PRN

## 2023-10-16 MED ORDER — PROPOFOL 10 MG/ML IV BOLUS
INTRAVENOUS | Status: DC | PRN
  Administered 2023-10-16: 70 mg via INTRAVENOUS

## 2023-10-16 NOTE — CV Procedure (Signed)
Cardioversion procedure note For atrial fibrillation, persistent  Procedure Details:  Consent: Risks of procedure as well as the alternatives and risks of each were explained to the (patient/caregiver).  Consent for procedure obtained.  Time Out: Verified patient identification, verified procedure, site/side was marked, verified correct patient position, special equipment/implants available, medications/allergies/relevent history reviewed, required imaging and test results available.  Performed  Patient placed on cardiac monitor, pulse oximetry, supplemental oxygen as necessary.   Sedation given: propofol IV, Dr. Piscitello Pacer pads placed anterior and posterior chest.   Cardioverted 1 time(s).   Cardioverted at 150 J. Synchronized biphasic Converted to NSR   Evaluation: Findings: Post procedure EKG shows: NSR Complications: None Patient did tolerate procedure well.  Time Spent Directly with the Patient:  45 minutes   Tim Gollan, M.D., Ph.D.  

## 2023-10-16 NOTE — Anesthesia Preprocedure Evaluation (Signed)
 Anesthesia Evaluation  Patient identified by MRN, date of birth, ID band Patient awake    Reviewed: Allergy & Precautions, NPO status , Patient's Chart, lab work & pertinent test results  History of Anesthesia Complications Negative for: history of anesthetic complications  Airway Mallampati: III  TM Distance: <3 FB Neck ROM: full    Dental  (+) Chipped   Pulmonary neg pulmonary ROS, neg shortness of breath, former smoker   Pulmonary exam normal        Cardiovascular Exercise Tolerance: Good hypertension, + dysrhythmias Atrial Fibrillation  Rhythm:irregular Rate:Normal     Neuro/Psych negative neurological ROS  negative psych ROS   GI/Hepatic negative GI ROS, Neg liver ROS,neg GERD  ,,  Endo/Other  diabetes, Type 2    Renal/GU negative Renal ROS  negative genitourinary   Musculoskeletal   Abdominal   Peds  Hematology negative hematology ROS (+)   Anesthesia Other Findings Past Medical History: No date: Diabetes mellitus, type 2 (HCC) No date: Erectile dysfunction No date: Exogenous obesity No date: Hx of gout No date: Hyperlipidemia No date: Hypertension No date: Hypokalemia No date: Prostate cancer (HCC)  Past Surgical History: No date: CHOLECYSTECTOMY  BMI    Body Mass Index: 27.15 kg/m      Reproductive/Obstetrics negative OB ROS                             Anesthesia Physical Anesthesia Plan  ASA: 3  Anesthesia Plan: General   Post-op Pain Management:    Induction: Intravenous  PONV Risk Score and Plan: Propofol  infusion and TIVA  Airway Management Planned: Natural Airway and Nasal Cannula  Additional Equipment:   Intra-op Plan:   Post-operative Plan:   Informed Consent: I have reviewed the patients History and Physical, chart, labs and discussed the procedure including the risks, benefits and alternatives for the proposed anesthesia with the patient or  authorized representative who has indicated his/her understanding and acceptance.     Dental Advisory Given  Plan Discussed with: Anesthesiologist, CRNA and Surgeon  Anesthesia Plan Comments: (Patient consented for risks of anesthesia including but not limited to:  - adverse reactions to medications - risk of airway placement if required - damage to eyes, teeth, lips or other oral mucosa - nerve damage due to positioning  - sore throat or hoarseness - Damage to heart, brain, nerves, lungs, other parts of body or loss of life  Patient voiced understanding and assent.)       Anesthesia Quick Evaluation

## 2023-10-16 NOTE — Anesthesia Postprocedure Evaluation (Signed)
 Anesthesia Post Note  Patient: Devon Anderson  Procedure(s) Performed: CARDIOVERSION  Patient location during evaluation: Specials Recovery Anesthesia Type: General Level of consciousness: awake and alert Pain management: pain level controlled Vital Signs Assessment: post-procedure vital signs reviewed and stable Respiratory status: spontaneous breathing, nonlabored ventilation, respiratory function stable and patient connected to nasal cannula oxygen Cardiovascular status: blood pressure returned to baseline and stable Postop Assessment: no apparent nausea or vomiting Anesthetic complications: no   No notable events documented.   Last Vitals:  Vitals:   10/16/23 0815 10/16/23 0830  BP: (!) 166/83 (!) 170/88  Pulse: 71 71  Resp: 20 (!) 24  Temp:    SpO2: 96% 96%    Last Pain:  Vitals:   10/16/23 0702  TempSrc: Oral  PainSc: 0-No pain                 Portia Brittle Dmiyah Liscano

## 2023-10-16 NOTE — Transfer of Care (Signed)
 Immediate Anesthesia Transfer of Care Note  Patient: Devon Anderson  Procedure(s) Performed: CARDIOVERSION  Patient Location: Short Stay  Anesthesia Type:General  Level of Consciousness: drowsy  Airway & Oxygen Therapy: Patient Spontanous Breathing and Patient connected to nasal cannula oxygen  Post-op Assessment: Report given to RN, Post -op Vital signs reviewed and stable, and Patient moving all extremities  Post vital signs: Reviewed and stable  Last Vitals:  Vitals Value Taken Time  BP 144/68 10/16/23 0742  Temp    Pulse 70 10/16/23 0745  Resp 26 10/16/23 0745  SpO2 96 % 10/16/23 0745  Vitals shown include unfiled device data.  Last Pain:  Vitals:   10/16/23 0702  TempSrc: Oral  PainSc: 0-No pain         Complications: No notable events documented.

## 2023-10-17 ENCOUNTER — Encounter: Payer: Self-pay | Admitting: Cardiovascular Disease

## 2023-10-18 NOTE — H&P (Signed)
H&P Addendum, pre-cardioversion ° °Patient was seen and evaluated prior to -cardioversion procedure °Symptoms, prior testing details again confirmed with the patient °Patient examined, no significant change from prior exam °Lab work reviewed in detail personally by myself °Patient understands risk and benefit of the procedure,  °The risks (stroke, cardiac arrhythmias rarely resulting in the need for a temporary or permanent pacemaker, skin irritation or burns and complications associated with conscious sedation including aspiration, arrhythmia, respiratory failure and death), benefits (restoration of normal sinus rhythm) and alternatives of a direct current cardioversion were explained in detail °Patient willing to proceed. ° °Signed, °Tim Da Michelle, MD, Ph.D °[facility]  °

## 2023-10-18 NOTE — Interval H&P Note (Signed)
History and Physical Interval Note:  10/18/2023 3:09 PM  Devon Anderson  has presented today for surgery, with the diagnosis of Cardioversion  Afib.  The various methods of treatment have been discussed with the patient and family. After consideration of risks, benefits and other options for treatment, the patient has consented to  Procedure(s): CARDIOVERSION (N/A) as a surgical intervention.  The patient's history has been reviewed, patient examined, no change in status, stable for surgery.  I have reviewed the patient's chart and labs.  Questions were answered to the patient's satisfaction.     [name]

## 2023-10-21 ENCOUNTER — Inpatient Hospital Stay: Payer: Medicare HMO | Attending: Internal Medicine

## 2023-10-21 DIAGNOSIS — Z87891 Personal history of nicotine dependence: Secondary | ICD-10-CM | POA: Diagnosis not present

## 2023-10-21 DIAGNOSIS — R5383 Other fatigue: Secondary | ICD-10-CM | POA: Insufficient documentation

## 2023-10-21 DIAGNOSIS — Z833 Family history of diabetes mellitus: Secondary | ICD-10-CM | POA: Insufficient documentation

## 2023-10-21 DIAGNOSIS — C61 Malignant neoplasm of prostate: Secondary | ICD-10-CM | POA: Insufficient documentation

## 2023-10-21 DIAGNOSIS — M2550 Pain in unspecified joint: Secondary | ICD-10-CM | POA: Diagnosis not present

## 2023-10-21 DIAGNOSIS — R350 Frequency of micturition: Secondary | ICD-10-CM | POA: Insufficient documentation

## 2023-10-21 DIAGNOSIS — Z7901 Long term (current) use of anticoagulants: Secondary | ICD-10-CM | POA: Insufficient documentation

## 2023-10-21 DIAGNOSIS — Z818 Family history of other mental and behavioral disorders: Secondary | ICD-10-CM | POA: Diagnosis not present

## 2023-10-21 DIAGNOSIS — Z79899 Other long term (current) drug therapy: Secondary | ICD-10-CM | POA: Diagnosis not present

## 2023-10-21 DIAGNOSIS — Z9049 Acquired absence of other specified parts of digestive tract: Secondary | ICD-10-CM | POA: Diagnosis not present

## 2023-10-21 DIAGNOSIS — M549 Dorsalgia, unspecified: Secondary | ICD-10-CM | POA: Insufficient documentation

## 2023-10-21 DIAGNOSIS — R232 Flushing: Secondary | ICD-10-CM | POA: Insufficient documentation

## 2023-10-21 DIAGNOSIS — E785 Hyperlipidemia, unspecified: Secondary | ICD-10-CM | POA: Diagnosis not present

## 2023-10-21 DIAGNOSIS — K259 Gastric ulcer, unspecified as acute or chronic, without hemorrhage or perforation: Secondary | ICD-10-CM | POA: Diagnosis present

## 2023-10-21 DIAGNOSIS — D649 Anemia, unspecified: Secondary | ICD-10-CM

## 2023-10-21 LAB — CBC WITH DIFFERENTIAL (CANCER CENTER ONLY)
Abs Immature Granulocytes: 0.03 10*3/uL (ref 0.00–0.07)
Basophils Absolute: 0 10*3/uL (ref 0.0–0.1)
Basophils Relative: 0 %
Eosinophils Absolute: 0.4 10*3/uL (ref 0.0–0.5)
Eosinophils Relative: 5 %
HCT: 35.8 % — ABNORMAL LOW (ref 39.0–52.0)
Hemoglobin: 12 g/dL — ABNORMAL LOW (ref 13.0–17.0)
Immature Granulocytes: 0 %
Lymphocytes Relative: 23 %
Lymphs Abs: 1.7 10*3/uL (ref 0.7–4.0)
MCH: 29.1 pg (ref 26.0–34.0)
MCHC: 33.5 g/dL (ref 30.0–36.0)
MCV: 86.7 fL (ref 80.0–100.0)
Monocytes Absolute: 0.5 10*3/uL (ref 0.1–1.0)
Monocytes Relative: 7 %
Neutro Abs: 4.8 10*3/uL (ref 1.7–7.7)
Neutrophils Relative %: 65 %
Platelet Count: 260 10*3/uL (ref 150–400)
RBC: 4.13 MIL/uL — ABNORMAL LOW (ref 4.22–5.81)
RDW: 13.4 % (ref 11.5–15.5)
WBC Count: 7.5 10*3/uL (ref 4.0–10.5)
nRBC: 0 % (ref 0.0–0.2)

## 2023-10-21 LAB — CMP (CANCER CENTER ONLY)
ALT: 25 U/L (ref 0–44)
AST: 20 U/L (ref 15–41)
Albumin: 3.7 g/dL (ref 3.5–5.0)
Alkaline Phosphatase: 78 U/L (ref 38–126)
Anion gap: 10 (ref 5–15)
BUN: 33 mg/dL — ABNORMAL HIGH (ref 8–23)
CO2: 25 mmol/L (ref 22–32)
Calcium: 8.7 mg/dL — ABNORMAL LOW (ref 8.9–10.3)
Chloride: 100 mmol/L (ref 98–111)
Creatinine: 1.61 mg/dL — ABNORMAL HIGH (ref 0.61–1.24)
GFR, Estimated: 43 mL/min — ABNORMAL LOW (ref >60)
Glucose, Bld: 108 mg/dL — ABNORMAL HIGH (ref 70–99)
Potassium: 4 mmol/L (ref 3.5–5.1)
Sodium: 135 mmol/L (ref 135–145)
Total Bilirubin: 0.8 mg/dL (ref 0.0–1.2)
Total Protein: 6.7 g/dL (ref 6.5–8.1)

## 2023-10-21 LAB — FERRITIN: Ferritin: 187 ng/mL (ref 24–336)

## 2023-10-21 LAB — IRON AND TIBC
Iron: 100 ug/dL (ref 45–182)
Saturation Ratios: 33 % (ref 17.9–39.5)
TIBC: 304 ug/dL (ref 250–450)
UIBC: 204 ug/dL

## 2023-10-21 LAB — PSA: Prostatic Specific Antigen: 0.02 ng/mL (ref 0.00–4.00)

## 2023-10-28 ENCOUNTER — Inpatient Hospital Stay: Payer: Medicare HMO

## 2023-10-28 ENCOUNTER — Inpatient Hospital Stay (HOSPITAL_BASED_OUTPATIENT_CLINIC_OR_DEPARTMENT_OTHER): Payer: Medicare HMO | Admitting: Internal Medicine

## 2023-10-28 ENCOUNTER — Encounter: Payer: Self-pay | Admitting: Internal Medicine

## 2023-10-28 VITALS — BP 123/62 | HR 70 | Temp 97.6°F | Resp 16 | Wt 189.0 lb

## 2023-10-28 DIAGNOSIS — C61 Malignant neoplasm of prostate: Secondary | ICD-10-CM

## 2023-10-28 NOTE — Progress Notes (Signed)
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  His pretreatment PSA was 9.  S/p  IMRT to his prostate.  He has never undergone radical prostatectomy.His PSA subsequently went down to 0.2 in 2015.  More recently when patient had his blood work done his PSA was elevated at 1.6 and therefore patient has been referred to [place].  It appears that patient has never received any antiandrogen therapy in the past.    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      Prostate cancer (HCC)  04/24/2021 Initial Diagnosis   Prostate cancer (HCC)   04/24/2021 Cancer Staging   Staging form: Prostate, AJCC 8th Edition - Clinical stage from 04/24/2021: Stage IIB (cT1c, cN0, cM0, PSA: 9, Grade Group: 2) - Signed by Creig Hines, MD on 04/24/2021 Prostate specific antigen (PSA) range: Less than 10 Gleason score: 7 Histologic grading system: 5 grade system   10/18/2021 Cancer Staging   Staging form: Prostate, AJCC 8th Edition - Pathologic: Stage IVB (pM1a) - Signed by Earna Coder, MD on 10/18/2021     HISTORY OF PRESENTING ILLNESS: Alone.  Ambulating independently.  Devon Anderson 83 y.o.  male above history of castrate sensitive metastatic prostate cancer on eligard intermittent because of side effects is here for follow-up/review results of his PSA.   Patient had his heart shocked back into rhythm on the 15 of this month, which he states that he feels much better, they  did put him on eliquis 5 mg twice daily.  Notes to have improved fatigue since cardioversion. Patient last Eligard was in February 2023.  Patient continues to have intermittent hot flashes.  Chronic mild fatigue.  Not any worse.   Urinary frequency - improved with Flomax.  Denies any nausea vomiting abdominal pain.  No fever no chills.   Review of Systems  Constitutional:  Positive for malaise/fatigue. Negative for chills, diaphoresis, fever and weight loss.  HENT:  Negative for nosebleeds and sore throat.   Eyes:  Negative for double vision.  Respiratory:  Negative for cough, hemoptysis and sputum production.   Cardiovascular:  Negative for chest pain, palpitations, orthopnea and leg swelling.  Gastrointestinal:  Negative for abdominal pain, blood in stool, constipation, diarrhea, heartburn, melena, nausea and vomiting.  Genitourinary:  Positive for frequency. Negative for dysuria and urgency.  Musculoskeletal:  Positive for back pain and joint pain.  Skin: Negative.  Negative for itching and rash.  Neurological:  Negative for dizziness, tingling, focal weakness, weakness and headaches.  Endo/Heme/Allergies:  Does not bruise/bleed easily.  Psychiatric/Behavioral:  Negative for depression. The patient is not nervous/anxious and does not have insomnia.      MEDICAL HISTORY:  Past Medical History:  Diagnosis Date   Diabetes mellitus, type 2 (HCC)    Erectile dysfunction    Exogenous obesity    Hx of gout    Hyperlipidemia    Hypertension    Hypokalemia    Prostate cancer (HCC)     SURGICAL HISTORY: Past Surgical History:  Procedure Laterality Date   CARDIOVERSION N/A 10/16/2023   Procedure: CARDIOVERSION;  Surgeon: Antonieta Iba, MD;  Location: [facility] ORS;  Service: Cardiovascular;  Laterality: N/A;   CHOLECYSTECTOMY      SOCIAL HISTORY: Social History   Socioeconomic History   Marital status: Widowed    Spouse name: Not on file   Number of children: Not on file    Years of education: Not on file   Highest education level: Not on file  Occupational History   Not on file  Tobacco Use   Smoking status: Former    Current packs/day: 0.00    Types: Cigarettes    Quit date: 10/02/2008    Years since quitting: 15.0   Smokeless tobacco: Never  Vaping Use   Vaping status: Never Used  Substance and Sexual Activity   Alcohol use: Never   Drug use: Never   Sexual activity: Not on file  Other Topics Concern   Not on file  Social History Narrative   Lives with son, Margaretha Glassing.  Indoor pet-cat.   Social Drivers of Corporate investment banker Strain: Low Risk  (07/31/2022)   Received from [facility], [facility]   Overall Financial Resource Strain (CARDIA)    Difficulty of Paying Living Expenses: Not hard at all  Food Insecurity: No Food Insecurity (07/31/2022)   Received from [facility], [facility]   Hunger Vital Sign    Worried About Running Out of Food in the Last Year: Never true    Ran Out of Food in the Last Year: Never true  Transportation Needs: No Transportation Needs (07/31/2022)   Received from [facility]   [facility] - Transportation    Lack of Transportation (Medical): No    Lack of Transportation (Non-Medical): No  Physical Activity: Not on file  Stress: Not on file  Social Connections: Not on file  Intimate Partner Violence: Not on file    FAMILY HISTORY: Family History  Problem Relation Age of Onset   Alzheimer's disease Mother    Diabetes Father     ALLERGIES:  has no known allergies.  MEDICATIONS:  Current Outpatient Medications  Medication Sig Dispense Refill   amLODipine (NORVASC) 10 MG tablet Take 10 mg by mouth daily.     apixaban (ELIQUIS) 5 MG TABS tablet Take 1 tablet (5 mg total) by mouth 2 (two) times daily. 60 tablet 3   atenolol (TENORMIN) 100 MG tablet Take 100 mg by mouth daily.     atorvastatin (LIPITOR) 80 MG tablet Take 80 mg by mouth at bedtime.     cyanocobalamin  (VITAMIN B12) 1000 MCG tablet Take 1,000 mcg by mouth daily.     ferrous sulfate 325 (65 FE) MG tablet Take 325 mg by mouth daily with breakfast.     glipiZIDE (GLUCOTROL) 5 MG tablet Take 5 mg by mouth daily before breakfast.     loratadine (CLARITIN) 10 MG tablet Take 10 mg by mouth daily.     Melatonin 10 MG TABS Take 10 mg by mouth at bedtime as needed (sleep).     metFORMIN (GLUCOPHAGE) 1000 MG tablet Take 1,000 mg by mouth 2 (two) times daily.     NIACIN ER PO Take 100 mg by mouth daily.     omeprazole (PRILOSEC) 40 MG capsule Take 40 mg by mouth daily.     potassium chloride (KLOR-CON) 10 MEQ tablet Take 10 mEq by mouth 2 (two) times daily.  tamsulosin (FLOMAX) 0.4 MG CAPS capsule Take 1 capsule (0.4 mg total) by mouth daily after supper. 90 capsule 1   zolpidem (AMBIEN) 5 MG tablet Take 1 tablet (5 mg total) by mouth at bedtime as needed for sleep. 30 tablet 0   No current facility-administered medications for this visit.      Marland Kitchen  PHYSICAL EXAMINATION:  Vitals:   10/28/23 0929  BP: 123/62  Pulse: 70  Resp: 16  Temp: 97.6 F (36.4 C)  SpO2: 99%   Filed Weights   10/28/23 0929  Weight: 189 lb (85.7 kg)    Physical Exam Vitals and nursing note reviewed.  HENT:     Head: Normocephalic and atraumatic.     Mouth/Throat:     Pharynx: Oropharynx is clear.  Eyes:     Extraocular Movements: Extraocular movements intact.     Pupils: Pupils are equal, round, and reactive to light.  Cardiovascular:     Rate and Rhythm: Normal rate and regular rhythm.  Pulmonary:     Comments: Decreased breath sounds bilaterally.  Abdominal:     Palpations: Abdomen is soft.  Musculoskeletal:        General: Normal range of motion.     Cervical back: Normal range of motion.  Skin:    General: Skin is warm.  Neurological:     General: No focal deficit present.     Mental Status: He is alert and oriented to person, place, and time.  Psychiatric:        Behavior: Behavior normal.         Judgment: Judgment normal.      LABORATORY DATA:  I have reviewed the data as listed Lab Results  Component Value Date   WBC 7.5 10/21/2023   HGB 12.0 (L) 10/21/2023   HCT 35.8 (L) 10/21/2023   MCV 86.7 10/21/2023   PLT 260 10/21/2023   Recent Labs    06/18/23 0759 08/02/23 1447 10/03/23 0857 10/21/23 0809  NA 134* 138 141 135  K 3.8 4.2 4.0 4.0  CL 103 105 102 100  CO2 26 24 23 25   GLUCOSE 119* 131* 115* 108*  BUN 19 26* 15 33*  CREATININE 1.10 1.41* 1.17 1.61*  CALCIUM 8.7* 8.8* 9.1 8.7*  GFRNONAA >60 50*  --  43*  PROT 6.7 6.7  --  6.7  ALBUMIN 3.5 4.0  --  3.7  AST 11* 14*  --  20  ALT 13 14  --  25  ALKPHOS 54 62  --  78  BILITOT 1.0 0.5  --  0.8    RADIOGRAPHIC STUDIES: I have personally reviewed the radiological images as listed and agreed with the findings in the report. ECHOCARDIOGRAM COMPLETE Result Date: 10/01/2023    ECHOCARDIOGRAM REPORT   Patient Name:   Devon Anderson Date of Exam: 10/01/2023 Medical Rec #:  [id number]       Height:       70.0 in Accession #:    [id number]      Weight:       195.4 lb Date of Birth:  Jul 14, 1941        BSA:          2.067 m Patient Age:    82 years        BP:           130/66 mmHg Patient Gender: M               HR:  57-77 bpm. Exam Location:  [place] Procedure: 2D Echo, Cardiac Doppler and Color Doppler Indications:    I48.91* Unspeicified atrial fibrillation  History:        Patient has no prior history of Echocardiogram examinations.                 Arrythmias:Atrial Fibrillation,                 Signs/Symptoms:Dizziness/Lightheadedness; Risk                 Factors:Dyslipidemia, Former Smoker, Hypertension and Diabetes.  Sonographer:    Quentin Ore RDMS, RVT, RDCS Referring Phys: [id number] BRIAN AGBOR-ETANG IMPRESSIONS  1. Left ventricular ejection fraction, by estimation, is 55 to 60%. The left ventricle has normal function. The left ventricle has no regional wall motion abnormalities. There is mild left  ventricular hypertrophy. Left ventricular diastolic parameters are indeterminate.  2. Right ventricular systolic function is normal. The right ventricular size is normal.  3. The mitral valve is normal in structure. No evidence of mitral valve regurgitation.  4. The aortic valve is calcified. Aortic valve regurgitation is not visualized. Aortic valve sclerosis/calcification is present, without any evidence of aortic stenosis. Aortic valve mean gradient measures 5.6 mmHg.  5. Aortic dilatation noted. There is mild dilatation of the aortic root, measuring 42 mm.  6. The inferior vena cava is normal in size with greater than 50% respiratory variability, suggesting right atrial pressure of 3 mmHg. FINDINGS  Left Ventricle: Left ventricular ejection fraction, by estimation, is 55 to 60%. The left ventricle has normal function. The left ventricle has no regional wall motion abnormalities. The left ventricular internal cavity size was normal in size. There is  mild left ventricular hypertrophy. Left ventricular diastolic parameters are indeterminate. Right Ventricle: The right ventricular size is normal. No increase in right ventricular wall thickness. Right ventricular systolic function is normal. Left Atrium: Left atrial size was normal in size. Right Atrium: Right atrial size was normal in size. Pericardium: There is no evidence of pericardial effusion. Mitral Valve: The mitral valve is normal in structure. No evidence of mitral valve regurgitation. Tricuspid Valve: The tricuspid valve is normal in structure. Tricuspid valve regurgitation is mild. Aortic Valve: The aortic valve is calcified. Aortic valve regurgitation is not visualized. Aortic valve sclerosis/calcification is present, without any evidence of aortic stenosis. Aortic valve mean gradient measures 5.6 mmHg. Aortic valve peak gradient measures 10.0 mmHg. Aortic valve area, by VTI measures 2.11 cm. Pulmonic Valve: The pulmonic valve was not well visualized.  Pulmonic valve regurgitation is mild. Aorta: Aortic dilatation noted. There is mild dilatation of the aortic root, measuring 42 mm. Venous: The inferior vena cava is normal in size with greater than 50% respiratory variability, suggesting right atrial pressure of 3 mmHg. IAS/Shunts: No atrial level shunt detected by color flow Doppler.  LEFT VENTRICLE PLAX 2D LVIDd:         4.90 cm LVIDs:         3.60 cm LV PW:         1.30 cm LV IVS:        1.60 cm LVOT diam:     2.00 cm LV SV:         71 LV SV Index:   34 LVOT Area:     3.14 cm  LV Volumes (MOD) LV vol d, MOD A2C: 106.0 ml LV vol d, MOD A4C: 102.0 ml LV vol s, MOD A2C: 50.8 ml LV vol s, MOD  A4C: 47.1 ml LV SV MOD A2C:     55.2 ml LV SV MOD A4C:     102.0 ml LV SV MOD BP:      55.2 ml RIGHT VENTRICLE             IVC RV Basal diam:  4.00 cm     IVC diam: 2.00 cm RV Mid diam:    3.40 cm RV S prime:     11.50 cm/s TAPSE (M-mode): 1.3 cm LEFT ATRIUM           Index        RIGHT ATRIUM           Index LA diam:      4.50 cm 2.18 cm/m   RA Area:     24.60 cm LA Vol (A2C): 53.0 ml 25.64 ml/m  RA Volume:   78.10 ml  37.79 ml/m LA Vol (A4C): 63.2 ml 30.58 ml/m  AORTIC VALVE                     PULMONIC VALVE AV Area (Vmax):    2.07 cm      PV Vmax:       0.82 m/s AV Area (Vmean):   2.02 cm      PV Peak grad:  2.7 mmHg AV Area (VTI):     2.11 cm AV Vmax:           157.80 cm/s AV Vmean:          108.880 cm/s AV VTI:            0.335 m AV Peak Grad:      10.0 mmHg AV Mean Grad:      5.6 mmHg LVOT Vmax:         104.00 cm/s LVOT Vmean:        69.950 cm/s LVOT VTI:          0.226 m LVOT/AV VTI ratio: 0.67  AORTA Ao Root diam: 4.20 cm Ao Asc diam:  3.50 cm Ao Arch diam: 2.6 cm  SHUNTS Systemic VTI:  0.23 m Systemic Diam: 2.00 cm Debbe Odea MD Electronically signed by Debbe Odea MD Signature Date/Time: 10/01/2023/10:08:17 AM    Final     Prostate cancer [facility] #Recurrent-castrate sensitive prostate cancer/stage IV- PET JAN 11th,  2023- JAN- Solitary  enlarged radiotracer avid RIGHT external iliac lymph node consistent with prostate cancer nodal metastasis;  No evidence of local recurrence within the prostate gland/or metastatic disease. PSA December 2022-3.96.  # Status post Eligard in [facility] Feb 2023 [45 mg q6M]-  #  I reviewed the labs with the patient; and the fact that he had significant response in his PSA.  Discussed regarding intermittent ADT to make sure his quality of life is intact during these treatments.  He understands that treatments are indefinite; and not curative. JAN 2025-PSA= 0.02-  Okay to hold off Eligard today.  We will reassess again in 4 months- stable.   # Mild anemia- April 2024 [melena] s/p EGD-EGD showed gastric ulcers- [[facility]]-EGD x2 [summer 2024]  currently gentle iron [iron biglycinate; 28 mg ] 1 pill a day-  Hb 12- s/p venofer. Stable.   # A.fib - [s/p cardioversion; Dr.Gollan-JAN 2025]- NSR- monitor on eliquis- stable.  # increased frequency of urination [no urologist]: on  flomax- Stable.    # DM- FBG- 119- recommend monitoring closely at home-Stable.   # Elevated BP [at home 130/75]- monitor for now.  Stable.   # Hot flashes-G-1- Monitor for now .  Stable.   # ?  CKD II/III -Creatnine- GFR  40-50s- stable.   # Insomnia- ambien prn at bedtime [will refill]; future refills- need to follow up with PCP; ; discussed the risk of falls.   # DISPOSITION: # HOLD Eligard today # HOLD  venofer # follow up in 4 months; MD- 1 week prior-labs-cbc/cmp;iron studies; ferritin;PSA; possible Eligard; possible Venofer---Dr.B   All questions were answered. The patient knows to call the clinic with any problems, questions or concerns.   Earna Coder, MD 10/28/2023 10:16 AM

## 2023-10-28 NOTE — Assessment & Plan Note (Addendum)
#  Recurrent-castrate sensitive prostate cancer/stage IV- PET JAN 11th,  2023- JAN- Solitary enlarged radiotracer avid RIGHT external iliac lymph node consistent with prostate cancer nodal metastasis;  No evidence of local recurrence within the prostate gland/or metastatic disease. PSA December 2022-3.96.  # Status post Eligard in [facility] Feb 2023 [45 mg q6M]-  #  I reviewed the labs with the patient; and the fact that he had significant response in his PSA.  Discussed regarding intermittent ADT to make sure his quality of life is intact during these treatments.  He understands that treatments are indefinite; and not curative. JAN 2025-PSA= 0.02-  Okay to hold off Eligard today.  We will reassess again in 4 months- stable.   # Mild anemia- April 2024 [melena] s/p EGD-EGD showed gastric ulcers- [[facility]]-EGD x2 [summer 2024]  currently gentle iron [iron biglycinate; 28 mg ] 1 pill a day-  Hb 12- s/p venofer. Stable.   # A.fib - [s/p cardioversion; Dr.Gollan-JAN 2025]- NSR- monitor on eliquis- stable.  # increased frequency of urination [no urologist]: on  flomax- Stable.    # DM- FBG- 119- recommend monitoring closely at home-Stable.   # Elevated BP [at home 130/75]- monitor for now. Stable.   # Hot flashes-G-1- Monitor for now .  Stable.   # ?  CKD II/III -Creatnine- GFR  40-50s- stable.   # Insomnia- ambien prn at bedtime [will refill]; future refills- need to follow up with PCP; ; discussed the risk of falls.   # DISPOSITION: # HOLD Eligard today # HOLD  venofer # follow up in 4 months; MD- 1 week prior-labs-cbc/cmp;iron studies; ferritin;PSA; possible Eligard; possible Venofer---Dr.B

## 2023-10-28 NOTE — Progress Notes (Signed)
Patient had his heart shocked back into rhythm on the 15 of this month, which he states that he feels much better, they did put him on eliquis d mg twice daily.

## 2023-11-08 NOTE — ED Provider Notes (Signed)
 [facility] Emergency Department Provider Note  ED CLINICAL IMPRESSION:   Final diagnoses:  Weakness (Primary)  Dizziness   MEDICAL DECISION MAKING:   Initial Assessment/Plan and ED Course:  This is a 83 y.o. male with a history of: Patient Active Problem List  Diagnosis  . HTN (hypertension)  . DM2 (diabetes mellitus, type 2) (CMS-HCC)  . Acute kidney injury (CMS-HCC)  . Acute cholecystitis  . Hyperglycemia due to diabetes mellitus (CMS-HCC)  . Hyponatremia   Subjectively presents with weakness.  Upon triage, pt was slightly hypertensive (154/78), but vitals were otherwise WNL.   Triage investigations included CBC, 4plex, CMP, and EKG.  Cardiac, pulmonary, and neurological exams were unremarkable.  DDx includes, but is not limited to: I suspect orthostasis.  Given patient had instant improvement of his symptoms when he lied flat with EMS and then was orthostatic on blood pressures, I do suspect that is highest on my differential.  Patient was also hyperglycemic at the scene and hyperglycemic here so that is on the differential.  No chest pain or palpitations.  Low suspicion for cardiac cause.  Initial EKG does have a slightly prolonged QTc at 495.  Has been 480 before.  Plan to recheck prior to leaving.  Patient does have a history of paroxysmal A-fib so certainly could I have went into A-fib flare but now is in sinus rhythm right now.  ED Course  Initial labs are notable for 4 Plex negative.  Troponin negative.  CBC with leukocytosis or anemia.  CMP with a creatinine better than his baseline.  Glucose 240.   Repeat EKG with a QTc of 436.  I do not see any ST changes.  No inverted T waves.  T waves are little bit flat on this on V4 and 5 but I do not think it is clinically relevant in the absence of chest pain and normal troponin.  After 500 of fluids, repeat orthostats were initially blood pressure was 160/70 then dropped to 120 systolic within a minute and stayed at the 120s  systolic at 3 minutes.  Patient was asymptomatic.  Patient ambulated around the ED and was asymptomatic.  Final Assessment/Plan When considering the possible etiologies in combination with the clinical picture and work up, the symptoms/findings are most consistent with:    Final diagnoses:  Weakness (Primary)  Dizziness  Negative workup here.  All likely orthostatic in nature.  Patient has an appoint with cardiology next Wednesday.  I did consider putting a Zio patch on but given symptoms resolved and the history of low blood pressure was standing on the scene and then improving when lying flat and having some orthostatic vital sign changes here all signal this is the likely cause.  Given he was still normotensive after standing, I did not make any adjustments to his blood pressure medication and defer that to his outpatient cardiologist.  No chest pain or fluttering.  Of the lower suspicion this is ACS with some sort of cardiac etiology.  Could have been a bout of A-fib with RVR but patient is now in sinus rhythm.  Heart rate reported normal on seen - Instructed to follow up with their PCP and cardiology - Instructed to return to the emergency department if symptoms worsen - Educated on return precautions  - The patient was sent home with the following medication(s): Discharge Medication List as of 11/08/2023 11:24 AM      I discussed the findings of the medical work-up, my rationale behind the medical decision making  process and my treatment plan with the patient. The patient understands and is comfortable with the plan of care. All questions were answered.    MEDICAL HISTORY:    CHIEF COMPLAINT:  Weakness and Dizziness   HISTORY OF PRESENT ILLNESS:  Devon Anderson is a 83 y.o. male who presents to the [facility] ED today for weakness, which began an hour ago.  HPI: Pt was at [facility], and had been feeling fine when waking this morning, when he suddenly began feeling quite weak and dizzy.  He had to sit down, and he was unable to respond to questions, per family member. . Pt denies LOC. Pt endorsed a tingling sensation throughout his body. Pt also endorses nausea but denies chest pain, SOB, sore throat, or rhinorrhea. Pt then lied down, and his sx improved instantly and he started feeling better. EMS was called, and en route, his orthostatic BP was taken, and it was 120/78 while sitting and it was 78/44 when standing. Pt hasn't eaten yet today.  Pt reports he has a hx of a-fib, and he had an episode a few weeks ago when his heart had to be shocked after he went into a-fib. Pt doesn't always recognize when he goes into a-fib.   At his baseline, pt often gets tired quite easily. When he is exerting himself, even with activities like brushing his teeth, he would have to sit and rest to catch his breath. Pt reports this has improved since having his heart shocked. However, daughter reports pt has never been this bad before, as he is usually able to respond and answer questions.   Outside Historian(s) Daughter at bedside.  Records Reviewed None   SURGICAL HISTORY:  has a past surgical history that includes Vasectomy; pr xcapsl ctrc rmvl insj io lens prosth cplx wo ecp (Left, 03/22/2022); pr xcapsl ctrc rmvl insj io lens prosth cplx wo ecp (Right, 05/10/2022); pr lap,cholecystectomy (Bilateral, 07/30/2022); pr removal gallbladder (N/A, 07/30/2022); pr colonoscopy flx dx w/collj spec when pfrmd (N/A, 01/16/2023); pr upper gi endoscopy,biopsy (N/A, 01/16/2023); and pr upper gi endoscopy,biopsy (N/A, 04/19/2023).  OUTPATIENT MEDICATIONS: Prior to Admission medications   Medication Sig Start Date End Date Taking? Authorizing Provider  amLODIPine (NORVASC) 10 MG tablet Take 1 tablet (10 mg total) by mouth in the morning. 06/09/12  Yes [provider]  atenolol (TENORMIN) 100 MG tablet Take 1 tablet (100 mg total) by mouth daily.   Yes [provider]  atorvastatin (LIPITOR) 80 MG  tablet Take 1 tablet (80 mg total) by mouth daily.   Yes [provider]  cyanocobalamin, vitamin B-12, 1000 MCG tablet Take 1 tablet (1,000 mcg total) by mouth daily. 11/29/22  Yes [provider]  glipiZIDE (GLUCOTROL) 5 MG tablet Take 1 tablet (5 mg total) by mouth in the morning.   Yes [provider]  lisinopril-hydroCHLOROthiazide (PRINZIDE,ZESTORETIC) 20-12.5 mg per tablet Take 2 tablets by mouth daily. 11/19/22  Yes [provider]  loratadine (CLARITIN) 10 mg tablet Take 1 tablet (10 mg total) by mouth daily.   Yes [provider]  metFORMIN (GLUCOPHAGE) 1000 MG tablet Take 1 tablet (1,000 mg total) by mouth in the morning. 11/19/22  Yes [provider]  potassium chloride  (KLOR-CON ) 10 MEQ CR tablet Take 1 tablet (10 mEq total) by mouth two (2) times a day. 08/07/22  Yes [provider]  tamsulosin  (FLOMAX ) 0.4 mg capsule Take 1 capsule (0.4 mg total) by mouth. 10/24/22  Yes [provider]  amoxicillin-clavulanate (  AUGMENTIN) 875-125 mg per tablet Take 1 tablet by mouth two (2) times a day. Patient not taking: Reported on 11/08/2023 05/17/23   [provider]  aspirin (ECOTRIN) 81 MG tablet Take 1 tablet (81 mg total) by mouth daily. Patient not taking: Reported on 11/08/2023    [provider]  melatonin 10 mg Tab Take 1 tablet by mouth nightly as needed (insomnia).    [provider]  niacin 500 mg tablet Take 1 tablet (500 mg total) by mouth in the morning. 06/09/12   [provider]  NON FORMULARY 2 tablets. OTC Stool Softener (unsure of brand) - Taking 2 tablets by mouth every day. Patient not taking: Reported on 04/19/2023    [provider]  PAXLOVID CO-PACK 300 mg (150 mg x 2)-100 mg tablet Take 3 tablets by mouth two (2) times a day. Patient not taking: Reported on 11/08/2023 05/17/23   [provider]  TRUE METRIX GLUCOSE TEST STRIP Strp  05/13/23   [provider]   ALLERGIES: has no known allergies.  FAMILY HISTORY: family history includes Diabetes in his father; Heart disease in his father; Hypertension in his father.  SOCIAL HISTORY: Tobacco use:  reports that he has quit smoking. His smoking use included cigarettes. He has been exposed to tobacco smoke. He has never used smokeless tobacco.  Alcohol use:  reports that he does not currently use alcohol. Drug use:  reports no history of drug use.  PHYSICAL EXAM   ED Vital Signs: Vitals:   11/08/23 0916 11/08/23 1100 11/08/23 1103 11/08/23 1105  BP:  160/76 124/66 125/65  Pulse:  63 68 68  Resp:  21 20 18   Temp: 36.1 C (97 F)     TempSrc: Temporal     SpO2:  99% 98% 98%  Weight:       Physical Exam Vitals reviewed.  Constitutional:      General: He is not in acute distress.    Appearance: Normal appearance. He is not ill-appearing, toxic-appearing or diaphoretic.  HENT:     Head: Normocephalic and atraumatic.     Right Ear: External ear normal.     Left Ear: External ear normal.     Nose: Nose normal.  Eyes:     General: No visual field deficit. Cardiovascular:     Rate and Rhythm: Normal rate and regular rhythm.     Heart sounds: Normal heart sounds. No murmur heard. Pulmonary:     Effort: Pulmonary effort is normal.     Breath sounds: Normal breath sounds. No wheezing, rhonchi or rales.  Skin:    Coloration: Skin is not pale.  Neurological:     Mental Status: He is alert.     GCS: GCS eye subscore is 4. GCS verbal subscore is 5. GCS motor subscore is 6.     Cranial Nerves: No cranial nerve deficit, dysarthria or facial asymmetry.     Sensory: No sensory deficit.     Motor: No weakness or abnormal muscle tone.     Coordination: Finger-Nose-Finger Test and Heel to [employer] normal.     Gait: Gait normal.  Psychiatric:        Judgment: Judgment normal.    LABORATORY DATA   Labs Reviewed  COMPREHENSIVE METABOLIC PANEL - Abnormal; Notable for the following  components:      Result Value   Sodium 134 (*)    Chloride 97 (*)    BUN 22 (*)    Creatinine 1.40 (*)  eGFR CKD-EPI (2021) Male 50 (*)    Glucose 240 (*)    All other components within normal limits  INFLUENZA/RSV/COVID PCR - Normal   Narrative:    The Xpert Xpress CoV-2/Flu/RSV (4plex) plus test is an automated in vitro diagnostic test for the simultaneous qualitative detection and differentiation of RNA from SARS-CoV-2, Flu A, Flu B, and RSV. This assays performance characteristics have been validated by the [facility]. Negative results do not preclude SARS-CoV-2, influenza A virus, influenza B virus and/or RSV infection and should not beused as the sole basis for treatment or other patient management decisions. Negative results must be combined with clinical observations, patient history, and/or epidemiological information. This test has not been FDA cleared or approved and has been authorized by FDA under an EUA for use by authorized laboratories. This test has been authorized only for the simultaneous qualitative detection and differentiation of nucleic acids from SARS-CoV-2, influenza A, influenza B, and respiratory syncytial virus (RSV), and not for any other viruses or pathogens. This test is only authorized for the duration of the declaration that circumstances exist justifying the authorization of emergency use of in vitro diagnostic tests for detection and/or diagnosis of COVID-19 under Section 564(b)(1) of the Act, 21 U.S.C. Section 360bbb-3(b)(1), unless the authorization is terminated or revoked sooner. Fact Sheet for Providers: seriousbroker.it Fact Sheet for Patients: bloggercourse.com  TROPONIN I - Normal  EXTRA TUBES   Narrative:    The following orders were created for panel order Extra Lab Tubes. Procedure                               Abnormality         Status                    ---------                                -----------         ------                    LIGHT BLUE CITRATE EXTR.SABRASABRA[[id number]]                      Final result               Please view results for these tests on the individual orders.  CBC W/ DIFFERENTIAL   Narrative:    The following orders were created for panel order CBC w/ Differential. Procedure                               Abnormality         Status                    ---------                               -----------         ------                    CBC w/ [id number]]  Final result               Please view results for these tests on the individual orders.  LIGHT BLUE CITRATE EXTRA TUBE   Narrative:    Collected and received in lab.  CBC W/ AUTO DIFF   RADIOLOGY   ECG 12 Lead Result Date: 11/08/2023 NORMAL SINUS RHYTHM PROLONGED QT ABNORMAL ECG   PROCEDURES  None   Pertinent labs & imaging results that were available during my care of the patient were reviewed by me and considered in my medical decision making. Labs and radiology studies included in my note may not constitute all ordered/reviewied labs during this encounter (see chart for details).  __________________________________________  Portions of this record have been created using Scientist, clinical (histocompatibility and immunogenetics). Dictation errors have been sought, but may not have been identified and corrected.  The chart was modified by Hildegard Berber using scribe services on November 08, 2023 9:25 AM, on behalf of Bernardino Gaster, DO  Documentation assistance was provided by the scribe in my presence. The documentation recorded by the scribe has been reviewed by me, amended with [employer], and accurately reflects the services I personally performed.   Gaster Bernardino Sharper, DO 11/08/23 1302

## 2023-11-08 NOTE — ED Triage Notes (Signed)
 Arrived via EMS from [facility] after getting dizzy and and weakness. Patient reports he is just feeling like shit. States a few weeks ago he was in a-fib and they had to shock my heart, which improved the feeling of weakness and dizziness greatly. Per EMS patient had +orthostatic blood pressure (120/78 sitting) and (78/44 standing) no change in HR. CBG 209, patient reports he hasn't eaten anything today and has hx of a-fib. Denies CP and SOB

## 2023-11-12 NOTE — Progress Notes (Unsigned)
Electrophysiology Office Note:    Date:  11/13/2023   ID:  Devon Anderson, DOB 01-17-1941, MRN [id number]  [facility] Cardiologist:  Debbe Odea, MD  [facility] Electrophysiologist:  Lanier Prude, MD   Referring MD: Debbe Odea, MD   Chief Complaint: Atrial fibrillation  History of Present Illness:    Devon Anderson is an 83 year old man who I am seeing today for an evaluation of atrial fibrillation at the request of Dr. Azucena Cecil.  The patient has a history of hypertension, hyperlipidemia, diabetes, stage IV prostate cancer.  At the appoint with Dr. Azucena Cecil the patient endorsed exertional dyspnea and an irregular heartbeat.  He reported some dizziness.  No syncope or presyncope.  He is on Eliquis for stroke prophylaxis. He had a cardioversion for his atrial fibrillation October 16, 2023.    He reported to the [facility] emergency department November 08, 2023 after a near syncopal episode at [place].  He was with his daughter.  He started feel lightheaded, sweaty, nauseous.  He sat down and the symptoms persisted.  EMS was called.  He was orthostatic so they transported him to the emergency department.  By the time he was in the ER his symptoms had completely resolved.  He reports similar episodes in the past but never this severe..  In the emergency department he was in sinus rhythm.       Their past medical, social and family history was reviewed.   ROS:   Please see the history of present illness.    All other systems reviewed and are negative.  EKGs/Labs/Other Studies Reviewed:    The following studies were reviewed today:  October 01, 2023 echo EF 55-60 RV normal No significant valvular disease Mild dilation of the aortic root  October 03, 2023 EKG shows atrial fibrillation with a ventricular rate of 58 bpm  October 16, 2023 EKG shows sinus rhythm, PR 209 ms, no preexcitation  EKG Interpretation Date/Time:  Wednesday November 13 2023  09:25:23 EST Ventricular Rate:  76 PR Interval:  192 QRS Duration:  84 QT Interval:  388 QTC Calculation: 436 R Axis:   59  Text Interpretation: Normal sinus rhythm Confirmed by Steffanie Dunn [phone]) on 11/13/2023 9:42:48 AM    Physical Exam:    VS:  BP 126/76   Pulse 76   Ht 5\' 10"  (1.778 m)   Wt 194 lb (88 kg)   SpO2 97%   BMI 27.84 kg/m     Wt Readings from Last 3 Encounters:  11/13/23 194 lb (88 kg)  10/28/23 189 lb (85.7 kg)  10/16/23 189 lb 3.2 oz (85.8 kg)     GEN: no distress.  Elderly CARD: RRR, No MRG RESP: No IWOB. CTAB.        ASSESSMENT AND PLAN:    1. Paroxysmal atrial fibrillation (HCC)   2. Primary hypertension     #Vasovagal near syncope The patient reports a recent episode of vasovagal near syncope.  He reports similar episodes in the past.  We discussed the pathophysiology of vasovagal syncope during today's clinic appointment.  We discussed the importance of getting to the ground immediately should he experience any of the prodromal symptoms associated with vasovagal syncope.  #Atrial fibrillation On Eliquis for stroke prophylaxis Cardioversion October 16, 2023.  Today we discussed treatment options for his atrial fibrillation including conservative management, antiarrhythmic drugs and catheter ablation.   His kidney function is abnormal with a creatinine of 1.61 on October 21, 2023.  #Hypertension  at goal today.  Recommend checking blood pressures 1-2 times per week at home and recording the values.  Recommend bringing these recordings to the primary care physician.  Follow-up with an APP in 6 months.    Signed, Rossie Muskrat. Lalla Brothers, MD, [facility], [facility] 11/13/2023 9:43 AM    Electrophysiology [facility]

## 2023-11-13 ENCOUNTER — Ambulatory Visit: Payer: Medicare HMO | Attending: Cardiology | Admitting: Cardiology

## 2023-11-13 ENCOUNTER — Encounter: Payer: Self-pay | Admitting: Cardiology

## 2023-11-13 VITALS — BP 126/76 | HR 76 | Ht 70.0 in | Wt 194.0 lb

## 2023-11-13 DIAGNOSIS — I10 Essential (primary) hypertension: Secondary | ICD-10-CM | POA: Diagnosis not present

## 2023-11-13 DIAGNOSIS — I480 Paroxysmal atrial fibrillation: Secondary | ICD-10-CM | POA: Diagnosis not present

## 2023-11-13 NOTE — Patient Instructions (Signed)
Medication Instructions:  Your physician recommends that you continue on your current medications as directed. Please refer to the Current Medication list given to you today.  *If you need a refill on your cardiac medications before your next appointment, please call your pharmacy*  Follow-Up: At [facility], you and your health needs are our priority.  As part of our continuing mission to provide you with exceptional heart care, we have created designated Provider Care Teams.  These Care Teams include your primary Cardiologist (physician) and Advanced Practice Providers (APPs -  Physician Assistants and Nurse Practitioners) who all work together to provide you with the care you need, when you need it.   Your next appointment:   6 months  Provider:   Sherie Don, NP

## 2023-12-13 ENCOUNTER — Telehealth: Payer: Self-pay | Admitting: Cardiology

## 2023-12-13 NOTE — Telephone Encounter (Signed)
 Pt c/o medication issue:  1. Name of Medication: apixaban (ELIQUIS) 5 MG TABS tablet   2. How are you currently taking this medication (dosage and times per day)? Yes 5mg , bid  3. Are you having a reaction (difficulty breathing--STAT)? No  4. What is your medication issue? Turning red and tingling sensation diff places troughout his body about after taking med

## 2023-12-13 NOTE — Telephone Encounter (Signed)
 The patient called to report that for the past month, 30 minutes after taking his morning medication, he becomes red and develops a tingling sensation in different areas of his body. The patient stated that the symptoms last for 30 minutes to an hour, then completely resolve. He believes the symptoms are related to Eliquis; however, he noted that he doesn't experience these symptoms after the nightly dose.   Morning medications listed below: Eliquis  Metformin Potassium Atenolol Glipizide  B12 Iron Omeprazole  Niacin  The nurse informed the patient that the symptoms may be related to his niacin medication as he confirmed he only takes it in the morning, but the information will be forwarded to our pharmacist for review and clarification.

## 2023-12-13 NOTE — Telephone Encounter (Signed)
 Pt made aware and verbalized understanding  Cheree Ditto, [facility]  You27 minutes ago (3:49 PM)    My first suspicion is Niacin which is known to cause flushing. Recommend he hold through the weekend to see if symptoms persist

## 2023-12-26 ENCOUNTER — Other Ambulatory Visit: Payer: Self-pay

## 2024-01-24 ENCOUNTER — Telehealth: Payer: Self-pay | Admitting: Cardiology

## 2024-01-24 NOTE — Telephone Encounter (Signed)
 Spoke with patient. Patient reports that he had a cardioversion in January.   Patient states that 3-4 weeks ago that he started having occasional palpitations. Patient states that over the past 3-4 days that he feels like he has converted to atrial fibrillation. Patient with complaint of shortness of breath with exertion and fatigue. Patient denies current symptoms. Patient reports blood pressure 132/77 and hr 67. Patient scheduled to be seen in clinic 01/29/24. Patient made aware of ED precaution should any new symptoms develop or worsen.

## 2024-01-24 NOTE — Telephone Encounter (Signed)
 Patient c/o Palpitations: STAT if patient c/o lightheadedness, shortness of breath, or chest pain  How long have you had palpitations/irregular HR/ Afib? Are you having the symptoms now? All week   Are you currently experiencing lightheadedness, SOB or CP? Lightheaded and SOB  Do you have a history of afib (atrial fibrillation) or irregular heart rhythm? Yes  Have you checked your BP or HR? (document readings if available): Today HR: 62  Are you experiencing any other symptoms? No just no energy

## 2024-01-29 ENCOUNTER — Ambulatory Visit: Attending: Cardiology | Admitting: Cardiology

## 2024-01-29 ENCOUNTER — Encounter: Payer: Self-pay | Admitting: Cardiology

## 2024-01-29 VITALS — BP 160/80 | HR 68 | Ht 70.0 in | Wt 198.0 lb

## 2024-01-29 DIAGNOSIS — E118 Type 2 diabetes mellitus with unspecified complications: Secondary | ICD-10-CM

## 2024-01-29 DIAGNOSIS — I10 Essential (primary) hypertension: Secondary | ICD-10-CM | POA: Diagnosis not present

## 2024-01-29 DIAGNOSIS — I480 Paroxysmal atrial fibrillation: Secondary | ICD-10-CM

## 2024-01-29 DIAGNOSIS — E782 Mixed hyperlipidemia: Secondary | ICD-10-CM

## 2024-01-29 NOTE — Progress Notes (Signed)
 Cardiology Office Note:  .   Date:  01/29/2024  ID:  Devon Anderson, DOB [birth year], MRN [id number] PCP: Driggers, Landon Pinion  [facility] Providers Cardiologist:  Constancia Delton, MD Electrophysiologist:  Boyce Byes, MD    History of Present Illness: .   Devon Anderson is a 83 y.o. male with past medical history of paroxysmal atrial fibrillation, hypertension, hyperlipidemia, prostate cancer, who is here today to follow-up on his paroxysmal atrial fibrillation.   Patient was first evaluated by [name] 07/13/2023 for DOE and increased fatigue at the request of PCP.  Patient reported 1 to 2-year history of worsening DOE and fatigue.  He denied any irregular heart beats, blood pressure device at home.  He also reports associated dizziness without syncope.  EKG performed during his initial office visit revealed atrial fibrillation with a heart rate of 66 bpm.  He was instructed to stop aspirin was started on apixaban .  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  He was then seen in clinic 10/03/2023 with continued ongoing dyspnea and fatigue.  Reported that his energy level was 0.  He was sent for updated labs of CBC and a BMP and scheduled for a direct-current cardioversion as he had not had any missed doses of apixaban .  He underwent DCCV on 10/16/2023 and was shocked in 150 J and converted to normal sinus rhythm.  He tolerated procedure well with no immediate complications.   He was last seen in clinic 11/13/2023 by Dr. Marven Slimmer.  At that time he had recently been evaluated at [facility] emergency department for brief December 2025 noted near syncopal episode at [facility].  EMS was called.  He was orthostatic so they transferred him to the emergency department.  By the time he was evaluated in the emergency department symptoms are completely  resolved.  EKG completed in clinic confirms he had maintain sinus rhythm.  He was continued on his current medication regimen and no further testing was ordered at that time.  He returns to clinic today accompanied by his daughter.  He stated that last week he had called back to A-fib with shortness of breath with, worsening fatigue, and palpitations.  He states he called into the clinic on Friday and this appointment was made.  He woke up on Saturday morning and all of his symptoms had resolved and he knew that he was back in a regular rhythm.  He stated he was in this episode of atrial fibrillation for approximately 4 days.  He had several questions today related to atrial fibrillation as well as triggers and medications.  Labs have been stable.  He states that he has not missed any of his apixaban  and denies any bleeding.  He denies any blood in his urine or stool.  Denies any hospitalizations or visits to the emergency department.  ROS: 10 point review of system has been reviewed and considered negative except ones are listed in the HPI  Studies Reviewed: Aaron Aas   EKG Interpretation Date/Time:  Wednesday January 29 2024 14:26:12 EDT Ventricular Rate:  68 PR Interval:  194 QRS Duration:  98 QT Interval:  432 QTC Calculation: 459 R Axis:   37  Text Interpretation: Normal sinus rhythm Nonspecific T wave abnormality Left ventricular hypertrophy When compared with ECG of 13-Nov-2023 09:25, Nonspecific T wave abnormality, improved in Lateral leads No significant change  was found Confirmed by [name] ([id number]) on 01/29/2024 2:34:23 PM    2D Echo 10/01/2023 1. Left ventricular ejection fraction, by estimation, is 55 to 60%. The  left ventricle has normal function. The left ventricle has no regional  wall motion abnormalities. There is mild left ventricular hypertrophy.  Left ventricular diastolic parameters  are indeterminate.   2. Right ventricular systolic function is normal. The right ventricular   size is normal.   3. The mitral valve is normal in structure. No evidence of mitral valve  regurgitation.   4. The aortic valve is calcified. Aortic valve regurgitation is not  visualized. Aortic valve sclerosis/calcification is present, without any  evidence of aortic stenosis. Aortic valve mean gradient measures 5.6 mmHg.   5. Aortic dilatation noted. There is mild dilatation of the aortic root,  measuring 42 mm.   6. The inferior vena cava is normal in size with greater than 50%  respiratory variability, suggesting right atrial pressure of 3 mmHg.  Risk Assessment/Calculations:    CHA2DS2-VASc Score = 3   This indicates a 3.2% annual risk of stroke. The patient's score is based upon: CHF History: 0 HTN History: 1 Diabetes History: 0 Stroke History: 0 Vascular Disease History: 0 Age Score: 2 Gender Score: 0    HYPERTENSION CONTROL Vitals:   01/29/24 1421 01/29/24 1425  BP: (!) 170/90 (!) 160/80    The patient's blood pressure is elevated above target today.  In order to address the patient's elevated BP: Blood pressure will be monitored at home to determine if medication changes need to be made.          Physical Exam:   VS:  BP (!) 160/80 (BP Location: Left Arm, Patient Position: Sitting, Cuff Size: Normal)   Pulse 68   Ht 5\' 10"  (1.778 m)   Wt 198 lb (89.8 kg)   SpO2 96%   BMI 28.41 kg/m    Wt Readings from Last 3 Encounters:  01/29/24 198 lb (89.8 kg)  11/13/23 194 lb (88 kg)  10/28/23 189 lb (85.7 kg)    GEN: Well nourished, well developed in no acute distress NECK: No JVD; No carotid bruits CARDIAC: RRR, no murmurs, rubs, gallops RESPIRATORY:  Clear to auscultation without rales, wheezing or rhonchi  ABDOMEN: Soft, non-tender, non-distended EXTREMITIES:  No edema; No deformity   ASSESSMENT AND PLAN: .   Paroxysmal atrial fibrillation there is no onset of December 2024.  He underwent DCCV in 11/2023 converted to normal sinus rhythm with 1 shock and 150  J.  He stated he recently had a bout of atrial fibrillation the last 4 days and spontaneously converted back to sinus rhythm on Saturday morning.  EKG today reveals sinus rhythm at a rate of 68 with nonspecific T waves no acute changes from prior studies that he said.  He states he has not missed any doses of his apixaban  and denies any bleeding.  He is currently on apixaban  5 mg twice daily for CHA2DS2-VASc score of at least 3 for stroke prophylaxis as well as his atenolol 100 mg daily.  For breakthrough episodes that are unrelenting he has been advised emergency situations he can take an additional half of atenolol to try to slow his rhythm.   Primary hypertension with blood pressure today 170/90 repeated 160/80.  Patient states blood pressure slightly elevated today but previously no elevations with negative pressure.  He has been continued on amlodipine 10 mg daily, atenolol 100 mg daily, losartan 50 mg daily.  He has been encouraged to continue to monitor his pressures 1 to 2 hours postmedication administration at home as well.  Mixed hyperlipidemia with an LDL of 106.  Where he has been continued on atorvastatin 80 mg daily.  Ongoing management per PCP.  Type 2 diabetes where he states his blood sugars are under 200.  He has been continued on metformin at 1000 mg twice daily, Jardiance 10 mg daily states he is no longer on glipizide.  Ongoing management per PCP.       Dispo: Patient return to clinic to see MD/APP previously scheduled appointment in July or sooner if needed  Signed, Brady Plant, NP

## 2024-01-29 NOTE — Patient Instructions (Addendum)
 Medication Instructions:  Your physician recommends that you continue on your current medications as directed. Please refer to the Current Medication list given to you today.  *If you need a refill on your cardiac medications before your next appointment, please call your pharmacy*  Lab Work: No labs ordered today  If you have labs (blood work) drawn today and your tests are completely normal, you will receive your results only by: MyChart Message (if you have MyChart) OR A paper copy in the mail If you have any lab test that is abnormal or we need to change your treatment, we will call you to review the results.  Testing/Procedures: No test ordered today   Follow-Up: At [facility], you and your health needs are our priority.  As part of our continuing mission to provide you with exceptional heart care, our providers are all part of one team.  This team includes your primary Cardiologist (physician) and Advanced Practice Providers or APPs (Physician Assistants and Nurse Practitioners) who all work together to provide you with the care you need, when you need it.  Your next appointment:   Keep appointment with Atlas Blank ,NP in July  Provider:   You may see Constancia Delton, MD or one of the following Advanced Practice Providers on your designated Care Team:   Laneta Pintos, NP Gildardo Labrador, PA-C Varney Gentleman, PA-C Cadence [place], PA-C [name], NP Morey Ar, NP    We recommend signing up for the patient portal called "MyChart".  Sign up information is provided on this After Visit Summary.  MyChart is used to connect with patients for Virtual Visits (Telemedicine).  Patients are able to view lab/test results, encounter notes, upcoming appointments, etc.  Non-urgent messages can be sent to your provider as well.   To learn more about what you can do with MyChart, go to [email or web address].

## 2024-02-18 ENCOUNTER — Inpatient Hospital Stay: Payer: Medicare HMO | Attending: Internal Medicine

## 2024-02-18 DIAGNOSIS — D649 Anemia, unspecified: Secondary | ICD-10-CM | POA: Diagnosis present

## 2024-02-18 DIAGNOSIS — C61 Malignant neoplasm of prostate: Secondary | ICD-10-CM

## 2024-02-18 DIAGNOSIS — Z87891 Personal history of nicotine dependence: Secondary | ICD-10-CM | POA: Diagnosis not present

## 2024-02-18 DIAGNOSIS — Z79899 Other long term (current) drug therapy: Secondary | ICD-10-CM | POA: Diagnosis not present

## 2024-02-18 DIAGNOSIS — R5383 Other fatigue: Secondary | ICD-10-CM | POA: Insufficient documentation

## 2024-02-18 DIAGNOSIS — Z833 Family history of diabetes mellitus: Secondary | ICD-10-CM | POA: Insufficient documentation

## 2024-02-18 LAB — CBC WITH DIFFERENTIAL (CANCER CENTER ONLY)
Abs Immature Granulocytes: 0.05 10*3/uL (ref 0.00–0.07)
Basophils Absolute: 0 10*3/uL (ref 0.0–0.1)
Basophils Relative: 1 %
Eosinophils Absolute: 0.3 10*3/uL (ref 0.0–0.5)
Eosinophils Relative: 4 %
HCT: 37.1 % — ABNORMAL LOW (ref 39.0–52.0)
Hemoglobin: 12.9 g/dL — ABNORMAL LOW (ref 13.0–17.0)
Immature Granulocytes: 1 %
Lymphocytes Relative: 22 %
Lymphs Abs: 1.6 10*3/uL (ref 0.7–4.0)
MCH: 30.2 pg (ref 26.0–34.0)
MCHC: 34.8 g/dL (ref 30.0–36.0)
MCV: 86.9 fL (ref 80.0–100.0)
Monocytes Absolute: 0.5 10*3/uL (ref 0.1–1.0)
Monocytes Relative: 7 %
Neutro Abs: 4.7 10*3/uL (ref 1.7–7.7)
Neutrophils Relative %: 65 %
Platelet Count: 243 10*3/uL (ref 150–400)
RBC: 4.27 MIL/uL (ref 4.22–5.81)
RDW: 12.3 % (ref 11.5–15.5)
WBC Count: 7.2 10*3/uL (ref 4.0–10.5)
nRBC: 0 % (ref 0.0–0.2)

## 2024-02-18 LAB — CMP (CANCER CENTER ONLY)
ALT: 22 U/L (ref 0–44)
AST: 19 U/L (ref 15–41)
Albumin: 4.1 g/dL (ref 3.5–5.0)
Alkaline Phosphatase: 76 U/L (ref 38–126)
Anion gap: 12 (ref 5–15)
BUN: 27 mg/dL — ABNORMAL HIGH (ref 8–23)
CO2: 24 mmol/L (ref 22–32)
Calcium: 8.8 mg/dL — ABNORMAL LOW (ref 8.9–10.3)
Chloride: 98 mmol/L (ref 98–111)
Creatinine: 1.29 mg/dL — ABNORMAL HIGH (ref 0.61–1.24)
GFR, Estimated: 56 mL/min — ABNORMAL LOW (ref >60)
Glucose, Bld: 170 mg/dL — ABNORMAL HIGH (ref 70–99)
Potassium: 3.1 mmol/L — ABNORMAL LOW (ref 3.5–5.1)
Sodium: 134 mmol/L — ABNORMAL LOW (ref 135–145)
Total Bilirubin: 0.9 mg/dL (ref 0.0–1.2)
Total Protein: 7.3 g/dL (ref 6.5–8.1)

## 2024-02-18 LAB — IRON AND TIBC
Iron: 78 ug/dL (ref 45–182)
Saturation Ratios: 23 % (ref 17.9–39.5)
TIBC: 336 ug/dL (ref 250–450)
UIBC: 258 ug/dL

## 2024-02-18 LAB — PSA: Prostatic Specific Antigen: 0.02 ng/mL (ref 0.00–4.00)

## 2024-02-18 LAB — FERRITIN: Ferritin: 151 ng/mL (ref 24–336)

## 2024-02-25 ENCOUNTER — Ambulatory Visit: Payer: Medicare HMO

## 2024-02-25 ENCOUNTER — Ambulatory Visit: Payer: Medicare HMO | Admitting: Nurse Practitioner

## 2024-03-03 ENCOUNTER — Encounter: Payer: Self-pay | Admitting: Nurse Practitioner

## 2024-03-03 ENCOUNTER — Inpatient Hospital Stay: Payer: Medicare HMO

## 2024-03-03 ENCOUNTER — Inpatient Hospital Stay: Payer: Medicare HMO | Attending: Nurse Practitioner | Admitting: Nurse Practitioner

## 2024-03-03 VITALS — BP 179/74 | HR 69 | Temp 97.1°F | Resp 16 | Wt 200.0 lb

## 2024-03-03 DIAGNOSIS — Z833 Family history of diabetes mellitus: Secondary | ICD-10-CM | POA: Diagnosis not present

## 2024-03-03 DIAGNOSIS — D649 Anemia, unspecified: Secondary | ICD-10-CM | POA: Insufficient documentation

## 2024-03-03 DIAGNOSIS — R232 Flushing: Secondary | ICD-10-CM | POA: Insufficient documentation

## 2024-03-03 DIAGNOSIS — Z79899 Other long term (current) drug therapy: Secondary | ICD-10-CM | POA: Insufficient documentation

## 2024-03-03 DIAGNOSIS — Z87891 Personal history of nicotine dependence: Secondary | ICD-10-CM | POA: Diagnosis not present

## 2024-03-03 DIAGNOSIS — D509 Iron deficiency anemia, unspecified: Secondary | ICD-10-CM

## 2024-03-03 DIAGNOSIS — I480 Paroxysmal atrial fibrillation: Secondary | ICD-10-CM | POA: Diagnosis not present

## 2024-03-03 DIAGNOSIS — R030 Elevated blood-pressure reading, without diagnosis of hypertension: Secondary | ICD-10-CM | POA: Diagnosis not present

## 2024-03-03 DIAGNOSIS — R5383 Other fatigue: Secondary | ICD-10-CM | POA: Diagnosis not present

## 2024-03-03 DIAGNOSIS — C61 Malignant neoplasm of prostate: Secondary | ICD-10-CM | POA: Diagnosis not present

## 2024-03-03 DIAGNOSIS — Z9049 Acquired absence of other specified parts of digestive tract: Secondary | ICD-10-CM | POA: Insufficient documentation

## 2024-03-03 DIAGNOSIS — N183 Chronic kidney disease, stage 3 unspecified: Secondary | ICD-10-CM | POA: Insufficient documentation

## 2024-03-03 DIAGNOSIS — Z7901 Long term (current) use of anticoagulants: Secondary | ICD-10-CM | POA: Diagnosis not present

## 2024-03-03 DIAGNOSIS — M2550 Pain in unspecified joint: Secondary | ICD-10-CM | POA: Insufficient documentation

## 2024-03-03 DIAGNOSIS — G4700 Insomnia, unspecified: Secondary | ICD-10-CM | POA: Insufficient documentation

## 2024-03-03 DIAGNOSIS — E785 Hyperlipidemia, unspecified: Secondary | ICD-10-CM | POA: Diagnosis not present

## 2024-03-03 DIAGNOSIS — R350 Frequency of micturition: Secondary | ICD-10-CM | POA: Insufficient documentation

## 2024-03-03 NOTE — Progress Notes (Signed)
 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  His pretreatment PSA was 9.  S/p  IMRT to his prostate.  He has never undergone radical prostatectomy.His PSA subsequently went down to 0.2 in 2015.  More recently when patient had his blood work done his PSA was elevated at 1.6 and therefore patient has been referred to us .  It appears that patient has never received any antiandrogen therapy in the past.    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      Prostate cancer (HCC)  04/24/2021 Initial Diagnosis   Prostate cancer (HCC)   04/24/2021 Cancer Staging   Staging form: Prostate, AJCC 8th Edition - Clinical stage from 04/24/2021: Stage IIB (cT1c, cN0, cM0, PSA: 9, Grade Group: 2) - Signed by Avonne Boettcher, MD on 04/24/2021 Prostate specific antigen (PSA) range: Less than 10 Gleason score: 7 Histologic grading system: 5 grade system   10/18/2021 Cancer Staging   Staging form: Prostate, AJCC 8th Edition - Pathologic: Stage IVB (pM1a) - Signed by Gwyn Leos, MD on 10/18/2021     HISTORY OF PRESENTING ILLNESS: Alone.  Ambulating independently.  Devon Anderson 83 y.o. male with above history of castrate sensitive metastatic prostate cancer on intermittent eligard  because of side effects is here for follow-up/review results of his PSA. He has intermittent a fib and feels poorly when he goes  into a fib. Has generally felt better since his cardioversion. Patient last Eligard  was in February 2023. Compliant with eliquis . Denies bone pain or urinary symptoms.   Review of Systems  Constitutional:  Positive for malaise/fatigue. Negative for chills, diaphoresis, fever and weight loss.  HENT:  Negative for nosebleeds and sore throat.   Eyes:  Negative for double vision.  Respiratory:  Negative for cough, hemoptysis and sputum production.   Cardiovascular:  Negative for chest pain, palpitations, orthopnea and leg swelling.  Gastrointestinal:  Negative for abdominal pain, blood in stool, constipation, diarrhea, heartburn, melena, nausea and vomiting.  Genitourinary:  Negative for dysuria, flank pain, frequency, hematuria and urgency.  Musculoskeletal:  Positive for joint pain. Negative for back pain.  Skin: Negative.  Negative for itching and rash.  Neurological:  Negative for dizziness, tingling, focal weakness, weakness and headaches.  Endo/Heme/Allergies:  Does not bruise/bleed easily.  Psychiatric/Behavioral:  Negative for depression. The patient is not nervous/anxious and does not have insomnia.      MEDICAL HISTORY:  Past Medical History:  Diagnosis Date   Diabetes mellitus, type 2 (HCC)    Erectile dysfunction    Exogenous obesity    Hx of gout    Hyperlipidemia    Hypertension    Hypokalemia    Prostate cancer (HCC)     SURGICAL HISTORY: Past Surgical History:  Procedure Laterality Date   CARDIOVERSION N/A 10/16/2023   Procedure: CARDIOVERSION;  Surgeon: Devorah Fonder, MD;  Location: [facility] ORS;  Service: Cardiovascular;  Laterality: N/A;   CHOLECYSTECTOMY  SOCIAL HISTORY: Social History   Socioeconomic History   Marital status: Widowed    Spouse name: Not on file   Number of children: Not on file   Years of education: Not on file   Highest education level: Not on file  Occupational History   Not on file  Tobacco Use   Smoking status: Former     Current packs/day: 0.00    Types: Cigarettes    Quit date: 10/02/2008    Years since quitting: 15.4   Smokeless tobacco: Never  Vaping Use   Vaping status: Never Used  Substance and Sexual Activity   Alcohol use: Never   Drug use: Never   Sexual activity: Not on file  Other Topics Concern   Not on file  Social History Narrative   Lives with son, Rowena Copa.  Indoor pet-cat.   Social Drivers of Corporate investment banker Strain: Low Risk  (07/31/2022)   Received from [facility], [facility]   Overall Financial Resource Strain (CARDIA)    Difficulty of Paying Living Expenses: Not hard at all  Food Insecurity: No Food Insecurity (07/31/2022)   Received from [facility], [facility]   Hunger Vital Sign    Worried About Running Out of Food in the Last Year: Never true    Ran Out of Food in the Last Year: Never true  Transportation Needs: No Transportation Needs (07/31/2022)   Received from [facility], [facility]   [facility] - Transportation    Lack of Transportation (Medical): No    Lack of Transportation (Non-Medical): No  Physical Activity: Not on file  Stress: Not on file  Social Connections: Not on file  Intimate Partner Violence: Not on file    FAMILY HISTORY: Family History  Problem Relation Age of Onset   Alzheimer's disease Mother    Diabetes Father     ALLERGIES:  has no known allergies.  MEDICATIONS:  Current Outpatient Medications  Medication Sig Dispense Refill   glipiZIDE (GLUCOTROL) 5 MG tablet Take 5 mg by mouth daily before breakfast.     loratadine (CLARITIN) 10 MG tablet Take 10 mg by mouth daily.     losartan (COZAAR) 50 MG tablet Take 50 mg by mouth daily.     Melatonin 10 MG TABS Take 10 mg by mouth at bedtime as needed (sleep).     metFORMIN (GLUCOPHAGE) 1000 MG tablet Take 1,000 mg by mouth 2 (two) times daily.     NIACIN ER PO Take 100 mg by mouth daily.     omeprazole (PRILOSEC) 40 MG capsule Take 40 mg by mouth daily.      amLODipine (NORVASC) 10 MG tablet Take 10 mg by mouth daily.     amoxicillin-clavulanate (AUGMENTIN) 875-125 MG tablet Take 1 tablet by mouth 2 (two) times daily. (Patient not taking: Reported on 03/03/2024)     apixaban  (ELIQUIS ) 5 MG TABS tablet Take 1 tablet (5 mg total) by mouth 2 (two) times daily. 60 tablet 3   atenolol (TENORMIN) 100 MG tablet Take 100 mg by mouth daily.     atorvastatin (LIPITOR) 80 MG tablet Take 80 mg by mouth at bedtime.     cyanocobalamin (VITAMIN B12) 1000 MCG tablet Take 1,000 mcg by mouth daily. (Patient not taking: Reported on 01/29/2024)     ferrous sulfate 325 (65 FE) MG tablet Take 325 mg by mouth daily with breakfast.     JARDIANCE 10 MG TABS tablet Take 10 mg  by mouth daily. (Patient not taking: Reported on 03/03/2024)     potassium chloride (KLOR-CON) 10 MEQ tablet Take 10 mEq by mouth 2 (two) times daily. (Patient not taking: Reported on 01/29/2024)     tamsulosin  (FLOMAX ) 0.4 MG CAPS capsule Take 1 capsule (0.4 mg total) by mouth daily after supper. (Patient not taking: Reported on 01/29/2024) 90 capsule 1   zolpidem  (AMBIEN ) 5 MG tablet Take 1 tablet (5 mg total) by mouth at bedtime as needed for sleep. (Patient not taking: Reported on 01/29/2024) 30 tablet 0   No current facility-administered medications for this visit.    PHYSICAL EXAMINATION: Vitals:   03/03/24 0922  BP: (!) 179/74  Pulse: 69  Resp: 16  Temp: (!) 97.1 F (36.2 C)  SpO2: 97%   Filed Weights   03/03/24 0922  Weight: 200 lb (90.7 kg)   Physical Exam Vitals reviewed.  Constitutional:      Appearance: He is not ill-appearing.  HENT:     Head: Normocephalic and atraumatic.     Mouth/Throat:     Pharynx: Oropharynx is clear.  Eyes:     Extraocular Movements: Extraocular movements intact.     Pupils: Pupils are equal, round, and reactive to light.  Cardiovascular:     Rate and Rhythm: Normal rate and regular rhythm.     Heart sounds: Murmur heard.  Pulmonary:     Effort: No  respiratory distress.     Comments: Decreased breath sounds bilaterally.  Abdominal:     General: There is no distension.     Palpations: Abdomen is soft.  Musculoskeletal:        General: No deformity.  Lymphadenopathy:     Cervical: No cervical adenopathy.  Skin:    General: Skin is warm.     Coloration: Skin is not pale.  Neurological:     Mental Status: He is alert and oriented to person, place, and time.  Psychiatric:        Mood and Affect: Mood normal.        Behavior: Behavior normal.    LABORATORY DATA:  I have reviewed the data as listed Lab Results  Component Value Date   WBC 7.2 02/18/2024   HGB 12.9 (L) 02/18/2024   HCT 37.1 (L) 02/18/2024   MCV 86.9 02/18/2024   PLT 243 02/18/2024   Recent Labs    08/02/23 1447 10/03/23 0857 10/21/23 0809 02/18/24 0754  NA 138 141 135 134*  K 4.2 4.0 4.0 3.1*  CL 105 102 100 98  CO2 24 23 25 24   GLUCOSE 131* 115* 108* 170*  BUN 26* 15 33* 27*  CREATININE 1.41* 1.17 1.61* 1.29*  CALCIUM 8.8* 9.1 8.7* 8.8*  GFRNONAA 50*  --  43* 56*  PROT 6.7  --  6.7 7.3  ALBUMIN 4.0  --  3.7 4.1  AST 14*  --  20 19  ALT 14  --  25 22  ALKPHOS 62  --  78 76  BILITOT 0.5  --  0.8 0.9   Iron /TIBC/Ferritin/ %Sat    Component Value Date/Time   IRON  78 02/18/2024 0754   TIBC 336 02/18/2024 0754   FERRITIN 151 02/18/2024 0754   IRONPCTSAT 23 02/18/2024 0754     RADIOGRAPHIC STUDIES: I have personally reviewed the radiological images as listed and agreed with the findings in the report. No results found.  Assessment & plan:  Prostate cancer  # Recurrent-castrate sensitive prostate cancer/stage IV- PET JAN 11th,  2023- JAN-  Solitary enlarged radiotracer avid RIGHT external iliac lymph node consistent with prostate cancer nodal metastasis;  No evidence of local recurrence within the prostate gland/or metastatic disease. PSA Dec 2022 - 3.96. Status post Eligard  in [facility] Feb 2023 [45 mg q6M]. He had a significant response in his PSA  and has been receiving intermittent ADT d/t quality of life and side effects. Treatment indefinitive and with palliative intent. May 2025 PSA 0.02. Stable. Hold eligard . We will reassess again in 4 months.    # Mild anemia- April 2024 [melena] s/p EGD-EGD showed gastric ulcers- [[facility]]-EGD x2 [summer 2024]  currently gentle iron  [iron  biglycinate; 28 mg ] 1 pill a day- Hb 12.9. Ferritin 151. Iron  sat 23%. s/p venofer . Stable.    # A.fib - [s/p cardioversion; Dr.Gollan-JAN 2025]- NSR- monitor on eliquis - stable.   # increased frequency of urination [no urologist]: on  flomax - Stable.     # DM- FBG- 119- recommend monitoring closely at home- Stable.    # Elevated BP [at home 130/75]- Monitor at home.    # Hot flashes-G-1. Resolved.    # ? CKD II/III -Creatinine- GFR 40-50s- stable.    # Insomnia- ambien  prn at bedtime- managed by pcp.     # DISPOSITION: # HOLD Eligard  today # HOLD Venofer  # follow up in 4 months- labs (cbc,cmp, ferritin, iron  studies, PSA) # Week later- Dr Valentine Gasmen, +/- eligard  and/or venofer - la  No problem-specific Assessment & Plan notes found for this encounter.  All questions were answered. The patient knows to call the clinic with any problems, questions or concerns.   Nelda Balsam, NP 03/03/2024

## 2024-03-31 ENCOUNTER — Encounter: Payer: Self-pay | Admitting: *Deleted

## 2024-04-02 NOTE — Progress Notes (Signed)
 Cardiology Clinic Note   Date: 04/06/2024 ID: Devon Anderson, DOB [birth year], MRN [id number]  [facility] Providers Cardiologist:  Redell Cave, MD Electrophysiologist:  OLE ONEIDA HOLTS, MD   Chief Complaint   Devon Anderson is a 83 y.o. male who presents to the clinic today for routine follow up.   Patient Profile   Devon Anderson is followed by Dr. Cave and Dr. HOLTS for the history outlined below.      Past medical history significant for: Dyspnea. Echo 10/01/2023: EF 55 to 60%.  No RWMA.  Mild LVH.  Indeterminate diastolic parameters.  Normal RV size/function.  Aortic valve sclerosis/calcification without stenosis.  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  Normal size atria. PAF. DCCV 10/16/2023. Hypertension. Hyperlipidemia. Prostate cancer. Initial diagnosis 2022 with radiation. Recurrence 2024 on hormonal treatment. T2DM.  In summary, patient was first evaluated by Dr. Cave on 09/10/2023 for DOE and increased fatigue at the request of Leita Printers, PA-C.  Patient reported a 1 to 2-year history of worsening DOE and fatigue.  He noted irregular heartbeats on blood pressure device at home.  He reported associated dizziness without syncope.  EKG performed during office visit revealed A-fib, 66 bpm.  He was instructed to stop aspirin and was started on Eliquis .  Echo showed normal LV/RV function as detailed above.    Patient was seen in the office on 10/03/2023 for follow-up of new onset A-fib.  He was scheduled for DCCV which was performed on 10/16/2023.  Patient was evaluated in the ED on 11/08/2023 for near syncope and was found to have orthostatic hypotension.  He was maintaining sinus rhythm at that time.  He was given fluids and discharged to follow-up with PCP and cardiology as an outpatient.  Patient was last seen in the office by Tylene Lunch, NP on 01/29/2024.  He had contacted the office secondary to believing he was back in A-fib with  symptoms of shortness of breath, worsening fatigue, and palpitations.  He woke the following day with resolution of symptoms.  He feels he was in A-fib for approximately 4 days.  EKG demonstrated NSR 68 bpm.  Triggers were discussed with patient.  He was instructed to take an additional half of atenolol for breakthrough episodes of A-fib.  His BP was elevated at the time of his visit but typically he was normotensive.  He was encouraged to monitor BP and no medication changes were made.     History of Present Illness    Today, patient is accompanied by his daughter. He is doing well.  Patient denies shortness of breath, dyspnea on exertion,  orthopnea or PND. He reports bilateral ankle edema that is not present in the mornings and progresses throughout the day. No chest pain, pressure, or tightness. No palpitations.  He has not felt like he has been in afib in several weeks. He states when he wakes in the morning he lays on his side and listens to his heart beat. If it is regular it's going to be a good day. If it sounds irregular he will take an extra half of atenolol. He has not been very active recently secondary to the heat. When the weather is more mild he likes to walk for exercise.      ROS: All other systems reviewed and are otherwise negative except as noted in History of Present Illness.  EKGs/Labs Reviewed    EKG Interpretation Date/Time:  Monday April 06 2024 08:46:47 EDT Ventricular Rate:  61 PR Interval:  192 QRS Duration:  96 QT Interval:  436 QTC Calculation: 438 R Axis:   43  Text Interpretation: Normal sinus rhythm Nonspecific T wave abnormality When compared with ECG of 29-Jan-2024 14:26, No significant change was found Confirmed by Loistine Sober [phone]) on 04/06/2024 8:49:14 AM   02/18/2024: ALT 22; AST 19; BUN 27; Creatinine 1.29; Potassium 3.1; Sodium 134   02/18/2024: Hemoglobin 12.9; WBC Count 7.2    Risk Assessment/Calculations     CHA2DS2-VASc Score = 3    This indicates a 3.2% annual risk of stroke. The patient's score is based upon: CHF History: 0 HTN History: 1 Diabetes History: 0 Stroke History: 0 Vascular Disease History: 0 Age Score: 2 Gender Score: 0             Physical Exam    VS:  BP 134/82 (BP Location: Left Arm, Patient Position: Sitting, Cuff Size: Normal)   Pulse 61   Ht 5' 10 (1.778 m)   Wt 198 lb 6 oz (90 kg)   SpO2 98%   BMI 28.46 kg/m  , BMI Body mass index is 28.46 kg/m.  GEN: Well nourished, well developed, in no acute distress. Neck: No JVD or carotid bruits. Cardiac:  RRR.  No murmur. No rubs or gallops.   Respiratory:  Respirations regular and unlabored. Clear to auscultation without rales, wheezing or rhonchi. GI: Soft, nontender, nondistended. Extremities: Radials/DP/PT 2+ and equal bilaterally. No clubbing or cyanosis. No edema.  Skin: Warm and dry, no rash. Neuro: Strength intact.  Assessment & Plan   Dyspnea/fatigue Echo 10/01/2023 showed normal LV/RV function, indeterminate diastolic parameters, normal size atria.  Patient reports feeling well. He has not been able to exercise as much due to the heat but when the weather allows he walks for exercise.  -Continue to monitor.    PAF New onset December 2024.  S/p DCCV January 2025.  Denies spontaneous bleeding concerns.  Patient states he has not been in afib in several weeks. If he feels his heart is beating irregularly upon awakening he will take an extra dose of atenolol that is managing his symptoms well.  -Continue atenolol, Eliquis . Appropriate Eliquis  dose.   Hypertension BP today 134/82. No headache or dizziness reported.   -Continue amlodipine, atenolol, losartan.  Disposition: Return in 6 months or sooner as needed.          Signed, Sober HERO. Gedalia Mcmillon, DNP, NP-C

## 2024-04-06 ENCOUNTER — Encounter: Payer: Self-pay | Admitting: Student

## 2024-04-06 ENCOUNTER — Ambulatory Visit: Attending: Student | Admitting: Student

## 2024-04-06 VITALS — BP 134/82 | HR 61 | Ht 70.0 in | Wt 198.4 lb

## 2024-04-06 DIAGNOSIS — R5383 Other fatigue: Secondary | ICD-10-CM

## 2024-04-06 DIAGNOSIS — I480 Paroxysmal atrial fibrillation: Secondary | ICD-10-CM

## 2024-04-06 DIAGNOSIS — I10 Essential (primary) hypertension: Secondary | ICD-10-CM

## 2024-04-06 DIAGNOSIS — R0609 Other forms of dyspnea: Secondary | ICD-10-CM

## 2024-04-06 NOTE — Patient Instructions (Signed)
 Medication Instructions:   Your physician recommends that you continue on your current medications as directed. Please refer to the Current Medication list given to you today.   *If you need a refill on your cardiac medications before your next appointment, please call your pharmacy*  Lab Work:  No labs ordered today   If you have labs (blood work) drawn today and your tests are completely normal, you will receive your results only by: MyChart Message (if you have MyChart) OR A paper copy in the mail If you have any lab test that is abnormal or we need to change your treatment, we will call you to review the results.  Testing/Procedures:  No test ordered today   Follow-Up: At [facility], you and your health needs are our priority.  As part of our continuing mission to provide you with exceptional heart care, our providers are all part of one team.  This team includes your primary Cardiologist (physician) and Advanced Practice Providers or APPs (Physician Assistants and Nurse Practitioners) who all work together to provide you with the care you need, when you need it.  Your next appointment:    6 month(s)  Provider:    You may see Redell Cave, MD or one of the following Advanced Practice Providers on your designated Care Team:   Barnie Hila, NP

## 2024-06-15 ENCOUNTER — Telehealth: Payer: Self-pay | Admitting: Internal Medicine

## 2024-06-15 NOTE — Telephone Encounter (Signed)
 Appointments rescheduled due to provider being out of office. Mailed AVS left spoke to patient gave new appointments and mailed AVS

## 2024-06-30 ENCOUNTER — Inpatient Hospital Stay: Attending: Internal Medicine

## 2024-06-30 DIAGNOSIS — D649 Anemia, unspecified: Secondary | ICD-10-CM | POA: Diagnosis present

## 2024-06-30 DIAGNOSIS — C61 Malignant neoplasm of prostate: Secondary | ICD-10-CM | POA: Diagnosis present

## 2024-06-30 DIAGNOSIS — D509 Iron deficiency anemia, unspecified: Secondary | ICD-10-CM

## 2024-06-30 LAB — CBC WITH DIFFERENTIAL (CANCER CENTER ONLY)
Abs Immature Granulocytes: 0.02 K/uL (ref 0.00–0.07)
Basophils Absolute: 0 K/uL (ref 0.0–0.1)
Basophils Relative: 0 %
Eosinophils Absolute: 0.3 K/uL (ref 0.0–0.5)
Eosinophils Relative: 5 %
HCT: 35.6 % — ABNORMAL LOW (ref 39.0–52.0)
Hemoglobin: 12.2 g/dL — ABNORMAL LOW (ref 13.0–17.0)
Immature Granulocytes: 0 %
Lymphocytes Relative: 17 %
Lymphs Abs: 1.2 K/uL (ref 0.7–4.0)
MCH: 29.6 pg (ref 26.0–34.0)
MCHC: 34.3 g/dL (ref 30.0–36.0)
MCV: 86.4 fL (ref 80.0–100.0)
Monocytes Absolute: 0.5 K/uL (ref 0.1–1.0)
Monocytes Relative: 7 %
Neutro Abs: 5 K/uL (ref 1.7–7.7)
Neutrophils Relative %: 71 %
Platelet Count: 250 K/uL (ref 150–400)
RBC: 4.12 MIL/uL — ABNORMAL LOW (ref 4.22–5.81)
RDW: 13.2 % (ref 11.5–15.5)
WBC Count: 7.1 K/uL (ref 4.0–10.5)
nRBC: 0 % (ref 0.0–0.2)

## 2024-06-30 LAB — CMP (CANCER CENTER ONLY)
ALT: 20 U/L (ref 0–44)
AST: 19 U/L (ref 15–41)
Albumin: 4 g/dL (ref 3.5–5.0)
Alkaline Phosphatase: 83 U/L (ref 38–126)
Anion gap: 11 (ref 5–15)
BUN: 22 mg/dL (ref 8–23)
CO2: 27 mmol/L (ref 22–32)
Calcium: 8.8 mg/dL — ABNORMAL LOW (ref 8.9–10.3)
Chloride: 101 mmol/L (ref 98–111)
Creatinine: 1.26 mg/dL — ABNORMAL HIGH (ref 0.61–1.24)
GFR, Estimated: 57 mL/min — ABNORMAL LOW (ref >60)
Glucose, Bld: 163 mg/dL — ABNORMAL HIGH (ref 70–99)
Potassium: 2.9 mmol/L — ABNORMAL LOW (ref 3.5–5.1)
Sodium: 139 mmol/L (ref 135–145)
Total Bilirubin: 1.1 mg/dL (ref 0.0–1.2)
Total Protein: 7.1 g/dL (ref 6.5–8.1)

## 2024-06-30 LAB — IRON AND TIBC
Iron: 53 ug/dL (ref 45–182)
Saturation Ratios: 17 % — ABNORMAL LOW (ref 17.9–39.5)
TIBC: 311 ug/dL (ref 250–450)
UIBC: 258 ug/dL

## 2024-06-30 LAB — FERRITIN: Ferritin: 120 ng/mL (ref 24–336)

## 2024-06-30 LAB — PSA: Prostatic Specific Antigen: <0.02 ng/mL (ref 0.00–4.00)

## 2024-07-03 ENCOUNTER — Other Ambulatory Visit

## 2024-07-07 ENCOUNTER — Inpatient Hospital Stay

## 2024-07-07 ENCOUNTER — Inpatient Hospital Stay: Attending: Nurse Practitioner | Admitting: Nurse Practitioner

## 2024-07-07 ENCOUNTER — Other Ambulatory Visit

## 2024-07-07 ENCOUNTER — Encounter: Payer: Self-pay | Admitting: Nurse Practitioner

## 2024-07-07 VITALS — BP 175/85 | HR 66 | Temp 97.3°F | Resp 17 | Wt 204.0 lb

## 2024-07-07 DIAGNOSIS — E876 Hypokalemia: Secondary | ICD-10-CM | POA: Diagnosis not present

## 2024-07-07 DIAGNOSIS — R5383 Other fatigue: Secondary | ICD-10-CM | POA: Insufficient documentation

## 2024-07-07 DIAGNOSIS — Z820 Family history of epilepsy and other diseases of the nervous system: Secondary | ICD-10-CM | POA: Diagnosis not present

## 2024-07-07 DIAGNOSIS — Z833 Family history of diabetes mellitus: Secondary | ICD-10-CM | POA: Diagnosis not present

## 2024-07-07 DIAGNOSIS — D509 Iron deficiency anemia, unspecified: Secondary | ICD-10-CM

## 2024-07-07 DIAGNOSIS — Z87891 Personal history of nicotine dependence: Secondary | ICD-10-CM | POA: Diagnosis not present

## 2024-07-07 DIAGNOSIS — I129 Hypertensive chronic kidney disease with stage 1 through stage 4 chronic kidney disease, or unspecified chronic kidney disease: Secondary | ICD-10-CM | POA: Diagnosis not present

## 2024-07-07 DIAGNOSIS — N183 Chronic kidney disease, stage 3 unspecified: Secondary | ICD-10-CM | POA: Insufficient documentation

## 2024-07-07 DIAGNOSIS — Z8711 Personal history of peptic ulcer disease: Secondary | ICD-10-CM | POA: Diagnosis not present

## 2024-07-07 DIAGNOSIS — E1122 Type 2 diabetes mellitus with diabetic chronic kidney disease: Secondary | ICD-10-CM | POA: Insufficient documentation

## 2024-07-07 DIAGNOSIS — Z7901 Long term (current) use of anticoagulants: Secondary | ICD-10-CM | POA: Insufficient documentation

## 2024-07-07 DIAGNOSIS — D649 Anemia, unspecified: Secondary | ICD-10-CM | POA: Diagnosis present

## 2024-07-07 DIAGNOSIS — I4891 Unspecified atrial fibrillation: Secondary | ICD-10-CM | POA: Diagnosis not present

## 2024-07-07 DIAGNOSIS — M2550 Pain in unspecified joint: Secondary | ICD-10-CM | POA: Insufficient documentation

## 2024-07-07 DIAGNOSIS — G4700 Insomnia, unspecified: Secondary | ICD-10-CM | POA: Insufficient documentation

## 2024-07-07 DIAGNOSIS — Z79899 Other long term (current) drug therapy: Secondary | ICD-10-CM | POA: Insufficient documentation

## 2024-07-07 DIAGNOSIS — E785 Hyperlipidemia, unspecified: Secondary | ICD-10-CM | POA: Insufficient documentation

## 2024-07-07 DIAGNOSIS — C61 Malignant neoplasm of prostate: Secondary | ICD-10-CM | POA: Insufficient documentation

## 2024-07-07 DIAGNOSIS — Z9049 Acquired absence of other specified parts of digestive tract: Secondary | ICD-10-CM | POA: Diagnosis not present

## 2024-07-07 MED ORDER — POTASSIUM CHLORIDE ER 10 MEQ PO TBCR: 10.0000 meq | EXTENDED_RELEASE_TABLET | Freq: Two times a day (BID) | ORAL | 2 refills | Status: AC

## 2024-07-07 NOTE — Progress Notes (Signed)
 Patient here for oncology follow-up appointment, expresses no new concerns at this time.

## 2024-07-07 NOTE — Progress Notes (Signed)
 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  His pretreatment PSA was 9.  S/p  IMRT to his prostate.  He has never undergone radical prostatectomy.His PSA subsequently went down to 0.2 in 2015.  More recently when patient had his blood work done his PSA was elevated at 1.6 and therefore patient has been referred to us .  It appears that patient has never received any antiandrogen therapy in the past.    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      Prostate cancer (HCC)  04/24/2021 Initial Diagnosis   Prostate cancer (HCC)   04/24/2021 Cancer Staging   Staging form: Prostate, AJCC 8th Edition - Clinical stage from 04/24/2021: Stage IIB (cT1c, cN0, cM0, PSA: 9, Grade Group: 2) - Signed by Melanee Annah BROCKS, MD on 04/24/2021 Prostate specific antigen (PSA) range: Less than 10 Gleason score: 7 Histologic grading system: 5 grade system   10/18/2021 Cancer Staging   Staging form: Prostate, AJCC 8th Edition - Pathologic: Stage IVB (pM1a) - Signed by Rennie Cindy SAUNDERS, MD on 10/18/2021     HISTORY OF PRESENTING ILLNESS: Alone. Ambulating independently.  Devon Anderson 83 y.o. male with above history of castrate sensitive metastatic prostate cancer, on intermittent Eligard  due to side effects, returns to clinic for follow-up and discussion of lab results.  He generally feels well today and denies complaints.   No new bone pain or urinary symptoms.  His last Eligard  was in February 2023.  He receives intermittent iron  infusions for his history of kidney disease.  Last iron  infusion was November 2024.  He denies black or bloody stools.  Denies blood in his urine.  Review of Systems  Constitutional:  Positive for malaise/fatigue. Negative for chills, diaphoresis, fever and weight loss.  HENT:  Negative for nosebleeds and sore throat.   Eyes:  Negative for double vision.  Respiratory:  Negative for cough, hemoptysis and sputum production.   Cardiovascular:  Negative for chest pain, palpitations, orthopnea and leg swelling.  Gastrointestinal:  Negative for abdominal pain, blood in stool, constipation, diarrhea, heartburn, melena, nausea and vomiting.  Genitourinary:  Negative for dysuria, flank pain, frequency, hematuria and urgency.  Musculoskeletal:  Positive for joint pain. Negative for back pain.  Skin: Negative.  Negative for itching and rash.  Neurological:  Negative for dizziness, tingling, focal weakness, weakness and headaches.  Endo/Heme/Allergies:  Does not bruise/bleed easily.  Psychiatric/Behavioral:  Negative for depression. The patient is not nervous/anxious and does not have insomnia.      MEDICAL HISTORY:  Past Medical History:  Diagnosis Date   Diabetes mellitus, type 2 (HCC)    Erectile dysfunction    Exogenous obesity    Hx of gout    Hyperlipidemia    Hypertension    Hypokalemia    Prostate cancer [facility])     SURGICAL HISTORY: Past Surgical History:  Procedure Laterality Date   CARDIOVERSION N/A 10/16/2023   Procedure: CARDIOVERSION;  Surgeon: Gollan, Timothy J,  MD;  Location: [facility] ORS;  Service: Cardiovascular;  Laterality: N/A;   CHOLECYSTECTOMY      SOCIAL HISTORY: Social History   Socioeconomic History   Marital status: Widowed    Spouse name: Not on file   Number of children: Not on file   Years of education: Not on file   Highest education level: Not on file   Occupational History   Not on file  Tobacco Use   Smoking status: Former    Current packs/day: 0.00    Types: Cigarettes    Quit date: 10/02/2008    Years since quitting: 15.7   Smokeless tobacco: Never  Vaping Use   Vaping status: Never Used  Substance and Sexual Activity   Alcohol use: Never   Drug use: Never   Sexual activity: Not on file  Other Topics Concern   Not on file  Social History Narrative   Lives with son, Emeline.  Indoor pet-cat.   Social Drivers of Corporate investment banker Strain: Low Risk  (07/31/2022)   Received [facility]   Overall Financial Resource Strain (CARDIA)    Difficulty of Paying Living Expenses: Not hard at all  Food Insecurity: No Food Insecurity (07/31/2022)   Received [facility]   Hunger Vital Sign    Within the past 12 months, you worried that your food would run out before you got the money to buy more.: Never true    Within the past 12 months, the food you bought just didn't last and you didn't have money to get more.: Never true  Transportation Needs: No Transportation Needs (07/31/2022)   Received from [facility]    Lack of Transportation (Medical): No    Lack of Transportation (Non-Medical): No  Physical Activity: Not on file  Stress: Not on file  Social Connections: Not on file  Intimate Partner Violence: Not on file    FAMILY HISTORY: Family History  Problem Relation Age of Onset   Alzheimer's disease Mother    Diabetes Father     ALLERGIES:  has no known allergies.  MEDICATIONS:  Current Outpatient Medications  Medication Sig Dispense Refill   amLODipine (NORVASC) 10 MG tablet Take 10 mg by mouth daily.     apixaban  (ELIQUIS ) 5 MG TABS tablet Take 1 tablet (5 mg total) by mouth 2 (two) times daily. 60 tablet 3   atenolol (TENORMIN) 100 MG tablet Take 100 mg by mouth daily.     atorvastatin (LIPITOR) 80 MG tablet Take 80 mg by mouth at bedtime.     cyanocobalamin  (VITAMIN B12) 1000 MCG tablet Take 1,000 mcg by mouth daily.     ferrous sulfate 325 (65 FE) MG tablet Take 325 mg by mouth daily with breakfast.     glipiZIDE (GLUCOTROL) 5 MG tablet Take 5 mg by mouth daily before breakfast.     loratadine (CLARITIN) 10 MG tablet Take 10 mg by mouth daily.     losartan (COZAAR) 50 MG tablet Take 50 mg by mouth daily.     Melatonin 10 MG TABS Take 10 mg by mouth at bedtime as needed (sleep).     metFORMIN (GLUCOPHAGE) 1000 MG tablet Take 1,000 mg by mouth 2 (two) times daily.     NIACIN ER PO Take 100 mg by mouth daily.     omeprazole (PRILOSEC) 40 MG capsule Take 40 mg by mouth daily.     amoxicillin-clavulanate (AUGMENTIN) 875-125 MG tablet  Take 1 tablet by mouth 2 (two) times daily. (Patient not taking: Reported on 07/07/2024)     JARDIANCE 10 MG TABS tablet Take 10 mg by mouth daily. (Patient not taking: Reported on 07/07/2024)     potassium chloride (KLOR-CON) 10 MEQ tablet Take 10 mEq by mouth 2 (two) times daily. (Patient not taking: Reported on 07/07/2024)     tamsulosin  (FLOMAX ) 0.4 MG CAPS capsule Take 1 capsule (0.4 mg total) by mouth daily after supper. (Patient not taking: Reported on 07/07/2024) 90 capsule 1   zolpidem  (AMBIEN ) 5 MG tablet Take 1 tablet (5 mg total) by mouth at bedtime as needed for sleep. (Patient not taking: Reported on 07/07/2024) 30 tablet 0   No current facility-administered medications for this visit.    PHYSICAL EXAMINATION: Vitals:   07/07/24 1005 07/07/24 1008  BP: (!) 179/84 (!) 175/85  Pulse: 66   Resp: 17   Temp: (!) 97.3 F (36.3 C)   SpO2: 97%    Filed Weights   07/07/24 1005  Weight: 204 lb (92.5 kg)   Physical Exam Vitals reviewed.  Constitutional:      Appearance: He is not ill-appearing.  HENT:     Mouth/Throat:     Pharynx: Oropharynx is clear.  Eyes:     General: No scleral icterus.    Extraocular Movements: Extraocular movements intact.  Cardiovascular:     Rate and Rhythm: Normal rate and  regular rhythm.     Heart sounds: Murmur heard.  Pulmonary:     Effort: No respiratory distress.     Comments: Decreased breath sounds bilaterally.  Abdominal:     General: There is no distension.     Palpations: Abdomen is soft.  Musculoskeletal:        General: No deformity.  Lymphadenopathy:     Cervical: No cervical adenopathy.  Skin:    General: Skin is warm.     Coloration: Skin is not pale.  Neurological:     Mental Status: He is alert and oriented to person, place, and time.  Psychiatric:        Mood and Affect: Mood normal.        Behavior: Behavior normal.    LABORATORY DATA:  I have reviewed the data as listed Lab Results  Component Value Date   WBC 7.1 06/30/2024   HGB 12.2 (L) 06/30/2024   HCT 35.6 (L) 06/30/2024   MCV 86.4 06/30/2024   PLT 250 06/30/2024   Recent Labs    10/21/23 0809 02/18/24 0754 06/30/24 0758  NA 135 134* 139  K 4.0 3.1* 2.9*  CL 100 98 101  CO2 25 24 27   GLUCOSE 108* 170* 163*  BUN 33* 27* 22  CREATININE 1.61* 1.29* 1.26*  CALCIUM 8.7* 8.8* 8.8*  GFRNONAA 43* 56* 57*  PROT 6.7 7.3 7.1  ALBUMIN 3.7 4.1 4.0  AST 20 19 19   ALT 25 22 20   ALKPHOS 78 76 83  BILITOT 0.8 0.9 1.1   Iron /TIBC/Ferritin/ %Sat    Component Value Date/Time   IRON  53 06/30/2024 0757   TIBC 311 06/30/2024 0757   FERRITIN 120 06/30/2024 0757   IRONPCTSAT 17 (L) 06/30/2024 0757     RADIOGRAPHIC STUDIES: I have personally reviewed the radiological images as listed and agreed with the findings in the report. No results found.  Assessment & plan:  Prostate cancer  # Recurrent-castrate sensitive prostate cancer/stage IV- PET JAN 11th,  2023- JAN- Solitary enlarged radiotracer avid RIGHT external iliac lymph node  consistent with prostate cancer nodal metastasis;  No evidence of local recurrence within the prostate gland/or metastatic disease. PSA Dec 2022 - 3.96. Status post Eligard  in [facility] Feb 2023 [45 mg q6M]. He had a significant response in his PSA  and has been receiving intermittent ADT d/t quality of life and side effects. Treatment indefinitive and with palliative intent. May 2025 PSA 0.02. 06/30/24 PSA < 0.02/Undetectable. Stable.  Asymptomatic.  Hold eligard . We will reassess again in 4 months.    # Mild anemia- April 2024 [melena] s/p EGD-EGD showed gastric ulcers- [[facility]]-EGD x2 [summer 2024]  currently gentle iron  [iron  biglycinate; 28 mg ] 1 pill a day- Hb 12.2. Ferritin 120. Iron  sat 17%. s/p venofer , last November 2024.  Given his decreased iron  stores we discussed option of Venofer  today but he prefers to hold off.  Will continue to monitor.  # Hypokalemia- K 2.9 today. Stopped oral potassium. Refilled sent to restart.    # A.fib - [s/p cardioversion; Dr.Gollan-JAN 2025]- NSR- monitor on eliquis - stable.   # increased frequency of urination [no urologist]: on  flomax - Stable.     # DM- FBG- 119- recommend monitoring closely at home- Stable.    # Elevated BP [at home 130/75]-question whitecoat hypertension. Monitor at home.    # Hot flashes-G-1. Resolved.    # ? CKD II/III -Creatinine- GFR 40-50s- stable.    # Insomnia- ambien  prn at bedtime- managed by pcp.     # DISPOSITION: No iron  or eligard  today 4 mo- lab (cbc, cmp, ferritin, iron  studies, psa) Week later- Dr Rennie, +/- eligard , venofer - la  No problem-specific Assessment & Plan notes found for this encounter.  All questions were answered. The patient knows to call the clinic with any problems, questions or concerns.   Tinnie KANDICE Dawn, NP 07/07/2024

## 2024-07-10 ENCOUNTER — Ambulatory Visit

## 2024-07-10 ENCOUNTER — Ambulatory Visit: Admitting: Internal Medicine

## 2024-09-02 ENCOUNTER — Other Ambulatory Visit

## 2024-09-09 ENCOUNTER — Ambulatory Visit: Admitting: Internal Medicine

## 2024-09-09 ENCOUNTER — Ambulatory Visit

## 2024-10-09 NOTE — Progress Notes (Unsigned)
 "  Cardiology Clinic Note   Date: 10/09/2024 ID: Devon Anderson, DOB 05/09/1941, MRN [id number]  Primary Cardiologist:  Redell Cave, MD  Chief Complaint   Devon Anderson is a 84 y.o. male who presents to the clinic today for ***  Patient Profile   Devon Anderson is followed by *** for the history outlined below.      Past medical history significant for: Dyspnea. Echo 10/01/2023: EF 55 to 60%.  No RWMA.  Mild LVH.  Indeterminate diastolic parameters.  Normal RV size/function.  Aortic valve sclerosis/calcification without stenosis.  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  Normal size atria. PAF. DCCV 10/16/2023. Hypertension. Hyperlipidemia. Prostate cancer. Initial diagnosis 2022 with radiation. Recurrence 2024 on hormonal treatment. T2DM.  In summary, patient was first evaluated by Dr. Cave on 09/10/2023 for DOE and increased fatigue at the request of Leita Printers, PA-C.  Patient reported a 1 to 2-year history of worsening DOE and fatigue.  He noted irregular heartbeats on blood pressure device at home.  He reported associated dizziness without syncope.  EKG performed during office visit revealed A-fib, 66 bpm.  He was instructed to stop aspirin and was started on Eliquis .  Echo showed normal LV/RV function as detailed above.    Patient was seen in the office on 10/03/2023 for follow-up of new onset A-fib.  He was scheduled for DCCV which was performed on 10/16/2023.  Patient was evaluated in the ED on 11/08/2023 for near syncope and was found to have orthostatic hypotension.  He was maintaining sinus rhythm at that time.  He was given fluids and discharged to follow-up with PCP and cardiology as an outpatient.  Patient was seen in the office  on 01/29/2024.  He had contacted the office secondary to believing he was back in A-fib with symptoms of shortness of breath, worsening fatigue, and palpitations.  He woke the following day with resolution of symptoms.  He feels he was  in A-fib for approximately 4 days.  EKG demonstrated NSR 68 bpm.  Triggers were discussed with patient.  He was instructed to take an additional half of atenolol for breakthrough episodes of A-fib.  His BP was elevated at the time of his visit but typically he was normotensive.  He was encouraged to monitor BP and no medication changes were made.   Patient was last seen in the office by me on 04/06/2024 for routine follow-up.  Patient reported bilateral ankle edema present in the mornings and progressed throughout the day.  He had not had any recent episodes of A-fib.  His activity was limited by the high temperatures.  He was in normal sinus rhythm at the time of his visit.  No medication changes were made.     History of Present Illness    Today, patient ***  Dyspnea/fatigue Echo 10/01/2023 showed normal LV/RV function, indeterminate diastolic parameters, normal size atria.  Patient reports*** -Continue to monitor.    PAF New onset December 2024.  S/p DCCV January 2025.  Denies spontaneous bleeding concerns.  Patient*** -Continue atenolol, Eliquis . Appropriate Eliquis  dose.   Hypertension BP today***. No headache or dizziness reported.   -Continue amlodipine, atenolol, losartan.  ROS: All other systems reviewed and are otherwise negative except as noted in History of Present Illness.  EKGs/Labs Reviewed        06/30/2024: ALT 20; AST 19; BUN 22; Creatinine 1.26; Potassium 2.9; Sodium 139   06/30/2024: Hemoglobin 12.2; WBC Count 7.1   No results found for  requested labs within last 365 days.   No results found for requested labs within last 365 days.  ***  Risk Assessment/Calculations    {Does this patient have ATRIAL FIBRILLATION?[phone]} No BP recorded.  {Refresh Note OR Click here to enter BP  :1}***        Physical Exam    VS:  There were no vitals taken for this visit. , BMI There is no height or weight on file to calculate BMI.  GEN: Well nourished, well  developed, in no acute distress. Neck: No JVD or carotid bruits. Cardiac: *** RRR. *** No murmur. No rubs or gallops.   Respiratory:  Respirations regular and unlabored. Clear to auscultation without rales, wheezing or rhonchi. GI: Soft, nontender, nondistended. Extremities: Radials/DP/PT 2+ and equal bilaterally. No clubbing or cyanosis. No edema ***  Skin: Warm and dry, no rash. Neuro: Strength intact.  Assessment & Plan   ***  Disposition: ***     {Are you ordering a CV Procedure (e.g. stress test, cath, DCCV, TEE, etc)?   Press F2        :[id number]}   Signed, Barnie HERO. Halla Chopp, DNP, NP-C  "

## 2024-10-12 ENCOUNTER — Encounter: Payer: Self-pay | Admitting: Student

## 2024-10-12 ENCOUNTER — Ambulatory Visit: Admitting: Student

## 2024-10-12 VITALS — BP 148/72 | HR 63 | Ht 70.0 in | Wt 203.4 lb

## 2024-10-12 DIAGNOSIS — G4730 Sleep apnea, unspecified: Secondary | ICD-10-CM

## 2024-10-12 DIAGNOSIS — I480 Paroxysmal atrial fibrillation: Secondary | ICD-10-CM

## 2024-10-12 DIAGNOSIS — I10 Essential (primary) hypertension: Secondary | ICD-10-CM

## 2024-10-12 DIAGNOSIS — Z79899 Other long term (current) drug therapy: Secondary | ICD-10-CM

## 2024-10-12 NOTE — Patient Instructions (Signed)
 Medication Instructions:   Your physician recommends that you continue on your current medications as directed. Please refer to the Current Medication list given to you today.    *If you need a refill on your cardiac medications before your next appointment, please call your pharmacy*  Lab Work:  Your provider would like for you to have following labs drawn today BMet, CBC.    If you have labs (blood work) drawn today and your tests are completely normal, you will receive your results only by:  MyChart Message (if you have MyChart) OR  A paper copy in the mail If you have any lab test that is abnormal or we need to change your treatment, we will call you to review the results.  Testing/Procedures:  None ordered at this time   Referrals:  Your cardiologist has referred you to Pulmonology.   We have attached their office location and phone number below.  Please allow them 3-5 business days to reach out to you to make an appointment.  If you have not heard from their office within that time, please call them to schedule your appointment.    Follow-Up:  At [facility], you and your health needs are our priority.  As part of our continuing mission to provide you with exceptional heart care, our providers are all part of one team.  This team includes your primary Cardiologist (physician) and Advanced Practice Providers or APPs (Physician Assistants and Nurse Practitioners) who all work together to provide you with the care you need, when you need it.  Your next appointment:   2 week(s)  Provider:    Barnie Hila, NP    We recommend signing up for the patient portal called MyChart.  Sign up information is provided on this After Visit Summary.  MyChart is used to connect with patients for Virtual Visits (Telemedicine).  Patients are able to view lab/test results, encounter notes, upcoming appointments, etc.  Non-urgent messages can be sent to your provider as well.   To  learn more about what you can do with MyChart, go to [email or web address].   Other Instructions  If you are no longer in A-fib in 2 weeks, you may call and cancel your next office visit.  Then we will call you for a follow up appointment in about 6 months.

## 2024-10-13 ENCOUNTER — Ambulatory Visit: Payer: Self-pay | Admitting: Student

## 2024-10-13 LAB — CBC
Hematocrit: 35.9 % — ABNORMAL LOW (ref 37.5–51.0)
Hemoglobin: 11.8 g/dL — ABNORMAL LOW (ref 13.0–17.7)
MCH: 29.4 pg (ref 26.6–33.0)
MCHC: 32.9 g/dL (ref 31.5–35.7)
MCV: 90 fL (ref 79–97)
Platelets: 278 x10E3/uL (ref 150–450)
RBC: 4.01 x10E6/uL — ABNORMAL LOW (ref 4.14–5.80)
RDW: 13.4 % (ref 11.6–15.4)
WBC: 8.2 x10E3/uL (ref 3.4–10.8)

## 2024-10-13 LAB — BASIC METABOLIC PANEL WITH GFR
BUN/Creatinine Ratio: 16 (ref 10–24)
BUN: 20 mg/dL (ref 8–27)
CO2: 23 mmol/L (ref 20–29)
Calcium: 9.4 mg/dL (ref 8.6–10.2)
Chloride: 103 mmol/L (ref 96–106)
Creatinine, Ser: 1.29 mg/dL — ABNORMAL HIGH (ref 0.76–1.27)
Glucose: 141 mg/dL — ABNORMAL HIGH (ref 70–99)
Potassium: 3.9 mmol/L (ref 3.5–5.2)
Sodium: 143 mmol/L (ref 134–144)
eGFR: 55 mL/min/1.73 — ABNORMAL LOW

## 2024-10-26 ENCOUNTER — Ambulatory Visit: Admitting: Student

## 2024-11-01 ENCOUNTER — Telehealth: Payer: Self-pay | Admitting: Internal Medicine

## 2024-11-01 NOTE — Telephone Encounter (Signed)
 Called pt and informed we will have  delay in opening on tueday. Pt agreed to come for his lab only appt at 1015

## 2024-11-03 ENCOUNTER — Other Ambulatory Visit: Payer: Self-pay | Admitting: *Deleted

## 2024-11-03 ENCOUNTER — Inpatient Hospital Stay

## 2024-11-03 ENCOUNTER — Other Ambulatory Visit: Payer: Self-pay

## 2024-11-03 DIAGNOSIS — D509 Iron deficiency anemia, unspecified: Secondary | ICD-10-CM

## 2024-11-03 DIAGNOSIS — D649 Anemia, unspecified: Secondary | ICD-10-CM

## 2024-11-03 DIAGNOSIS — C61 Malignant neoplasm of prostate: Secondary | ICD-10-CM

## 2024-11-03 LAB — CBC WITH DIFFERENTIAL (CANCER CENTER ONLY)
Abs Immature Granulocytes: 0.02 10*3/uL (ref 0.00–0.07)
Basophils Absolute: 0 10*3/uL (ref 0.0–0.1)
Basophils Relative: 0 %
Eosinophils Absolute: 0.6 10*3/uL — ABNORMAL HIGH (ref 0.0–0.5)
Eosinophils Relative: 11 %
HCT: 33.9 % — ABNORMAL LOW (ref 39.0–52.0)
Hemoglobin: 11.6 g/dL — ABNORMAL LOW (ref 13.0–17.0)
Immature Granulocytes: 0 %
Lymphocytes Relative: 18 %
Lymphs Abs: 1 10*3/uL (ref 0.7–4.0)
MCH: 29.4 pg (ref 26.0–34.0)
MCHC: 34.2 g/dL (ref 30.0–36.0)
MCV: 85.8 fL (ref 80.0–100.0)
Monocytes Absolute: 0.7 10*3/uL (ref 0.1–1.0)
Monocytes Relative: 13 %
Neutro Abs: 3.1 10*3/uL (ref 1.7–7.7)
Neutrophils Relative %: 58 %
Platelet Count: 205 10*3/uL (ref 150–400)
RBC: 3.95 MIL/uL — ABNORMAL LOW (ref 4.22–5.81)
RDW: 13.2 % (ref 11.5–15.5)
WBC Count: 5.4 10*3/uL (ref 4.0–10.5)
nRBC: 0 % (ref 0.0–0.2)

## 2024-11-03 LAB — CMP (CANCER CENTER ONLY)
ALT: 24 U/L (ref 0–44)
AST: 25 U/L (ref 15–41)
Albumin: 4.1 g/dL (ref 3.5–5.0)
Alkaline Phosphatase: 86 U/L (ref 38–126)
Anion gap: 12 (ref 5–15)
BUN: 17 mg/dL (ref 8–23)
CO2: 27 mmol/L (ref 22–32)
Calcium: 9 mg/dL (ref 8.9–10.3)
Chloride: 99 mmol/L (ref 98–111)
Creatinine: 1.35 mg/dL — ABNORMAL HIGH (ref 0.61–1.24)
GFR, Estimated: 52 mL/min — ABNORMAL LOW
Glucose, Bld: 114 mg/dL — ABNORMAL HIGH (ref 70–99)
Potassium: 3.8 mmol/L (ref 3.5–5.1)
Sodium: 137 mmol/L (ref 135–145)
Total Bilirubin: 0.4 mg/dL (ref 0.0–1.2)
Total Protein: 6.7 g/dL (ref 6.5–8.1)

## 2024-11-03 LAB — IRON AND TIBC
Iron: 51 ug/dL (ref 45–182)
Saturation Ratios: 17 % — ABNORMAL LOW (ref 17.9–39.5)
TIBC: 295 ug/dL (ref 250–450)
UIBC: 245 ug/dL

## 2024-11-03 LAB — FERRITIN: Ferritin: 257 ng/mL (ref 24–336)

## 2024-11-03 LAB — PSA: Prostatic Specific Antigen: 0.03 ng/mL (ref 0.00–4.00)

## 2024-11-04 ENCOUNTER — Ambulatory Visit: Admitting: Internal Medicine

## 2024-11-04 ENCOUNTER — Inpatient Hospital Stay

## 2024-11-10 ENCOUNTER — Inpatient Hospital Stay: Admitting: Internal Medicine

## 2024-11-10 ENCOUNTER — Inpatient Hospital Stay
